# Patient Record
Sex: Female | Born: 1985 | Race: White | Hispanic: No | Marital: Single | Smoking: Never smoker
Health system: Southern US, Community
[De-identification: ages and names within clinical notes are randomized; demographics above are authoritative.]

## PROBLEM LIST (undated history)

## (undated) DIAGNOSIS — S8254XA Nondisplaced fracture of medial malleolus of right tibia, initial encounter for closed fracture: Secondary | ICD-10-CM

## (undated) DIAGNOSIS — S91001A Unspecified open wound, right ankle, initial encounter: Secondary | ICD-10-CM

## (undated) DIAGNOSIS — S82143A Displaced bicondylar fracture of unspecified tibia, initial encounter for closed fracture: Secondary | ICD-10-CM

## (undated) DIAGNOSIS — S9304XA Dislocation of right ankle joint, initial encounter: Secondary | ICD-10-CM

## (undated) DIAGNOSIS — R569 Unspecified convulsions: Secondary | ICD-10-CM

---

## 2015-08-07 ENCOUNTER — Emergency Department (HOSPITAL_COMMUNITY): Payer: No Typology Code available for payment source

## 2015-08-07 ENCOUNTER — Encounter (HOSPITAL_COMMUNITY): Payer: Self-pay | Admitting: Emergency Medicine

## 2015-08-07 ENCOUNTER — Inpatient Hospital Stay (HOSPITAL_COMMUNITY)
Admission: EM | Admit: 2015-08-07 | Discharge: 2015-08-14 | DRG: 464 | Disposition: A | Discharge status: Home Health | Payer: No Typology Code available for payment source | Attending: Orthopedic Surgery | Admitting: Orthopedic Surgery

## 2015-08-07 DIAGNOSIS — S8254XA Nondisplaced fracture of medial malleolus of right tibia, initial encounter for closed fracture: Secondary | ICD-10-CM | POA: Diagnosis present

## 2015-08-07 DIAGNOSIS — S82141A Displaced bicondylar fracture of right tibia, initial encounter for closed fracture: Secondary | ICD-10-CM

## 2015-08-07 DIAGNOSIS — Z79899 Other long term (current) drug therapy: Secondary | ICD-10-CM

## 2015-08-07 DIAGNOSIS — S20212A Contusion of left front wall of thorax, initial encounter: Secondary | ICD-10-CM

## 2015-08-07 DIAGNOSIS — S82143A Displaced bicondylar fracture of unspecified tibia, initial encounter for closed fracture: Secondary | ICD-10-CM

## 2015-08-07 DIAGNOSIS — S8253XA Displaced fracture of medial malleolus of unspecified tibia, initial encounter for closed fracture: Secondary | ICD-10-CM

## 2015-08-07 DIAGNOSIS — S9304XA Dislocation of right ankle joint, initial encounter: Secondary | ICD-10-CM

## 2015-08-07 DIAGNOSIS — R569 Unspecified convulsions: Secondary | ICD-10-CM | POA: Diagnosis present

## 2015-08-07 DIAGNOSIS — S8251XB Displaced fracture of medial malleolus of right tibia, initial encounter for open fracture type I or II: Secondary | ICD-10-CM | POA: Diagnosis present

## 2015-08-07 DIAGNOSIS — M25571 Pain in right ankle and joints of right foot: Secondary | ICD-10-CM | POA: Diagnosis present

## 2015-08-07 DIAGNOSIS — Y92410 Unspecified street and highway as the place of occurrence of the external cause: Secondary | ICD-10-CM | POA: Diagnosis not present

## 2015-08-07 DIAGNOSIS — D62 Acute posthemorrhagic anemia: Secondary | ICD-10-CM | POA: Diagnosis not present

## 2015-08-07 DIAGNOSIS — Z419 Encounter for procedure for purposes other than remedying health state, unspecified: Secondary | ICD-10-CM

## 2015-08-07 DIAGNOSIS — S82301A Unspecified fracture of lower end of right tibia, initial encounter for closed fracture: Secondary | ICD-10-CM

## 2015-08-07 DIAGNOSIS — T148XXA Other injury of unspecified body region, initial encounter: Secondary | ICD-10-CM

## 2015-08-07 DIAGNOSIS — S91001A Unspecified open wound, right ankle, initial encounter: Secondary | ICD-10-CM | POA: Diagnosis present

## 2015-08-07 HISTORY — DX: Unspecified convulsions: R56.9

## 2015-08-07 HISTORY — DX: Dislocation of right ankle joint, initial encounter: S93.04XA

## 2015-08-07 HISTORY — DX: Displaced bicondylar fracture of unspecified tibia, initial encounter for closed fracture: S82.143A

## 2015-08-07 HISTORY — DX: Nondisplaced fracture of medial malleolus of right tibia, initial encounter for closed fracture: S82.54XA

## 2015-08-07 HISTORY — DX: Unspecified open wound, right ankle, initial encounter: S91.001A

## 2015-08-07 LAB — URINALYSIS, ROUTINE W REFLEX MICROSCOPIC
BILIRUBIN URINE: NEGATIVE
Glucose, UA: NEGATIVE mg/dL
HGB URINE DIPSTICK: NEGATIVE
Ketones, ur: 40 mg/dL — AB
Nitrite: NEGATIVE
PROTEIN: NEGATIVE mg/dL
Specific Gravity, Urine: 1.02 (ref 1.005–1.030)
pH: 5 (ref 5.0–8.0)

## 2015-08-07 LAB — BASIC METABOLIC PANEL
Anion gap: 10 (ref 5–15)
BUN: 11 mg/dL (ref 6–20)
CO2: 19 mmol/L — ABNORMAL LOW (ref 22–32)
Calcium: 9.1 mg/dL (ref 8.9–10.3)
Chloride: 107 mmol/L (ref 101–111)
Creatinine, Ser: 0.87 mg/dL (ref 0.44–1.00)
GFR calc Af Amer: >60 mL/min (ref >60)
GFR calc non Af Amer: >60 mL/min (ref >60)
Glucose, Bld: 117 mg/dL — ABNORMAL HIGH (ref 65–99)
Potassium: 3.5 mmol/L (ref 3.5–5.1)
Sodium: 136 mmol/L (ref 135–145)

## 2015-08-07 LAB — CBC WITH DIFFERENTIAL/PLATELET
Basophils Absolute: 0 10*3/uL (ref 0.0–0.1)
Basophils Relative: 0 %
Eosinophils Absolute: 0 10*3/uL (ref 0.0–0.7)
Eosinophils Relative: 0 %
HCT: 39.3 % (ref 36.0–46.0)
Hemoglobin: 13.1 g/dL (ref 12.0–15.0)
Lymphocytes Relative: 10 %
Lymphs Abs: 2.1 10*3/uL (ref 0.7–4.0)
MCH: 30.7 pg (ref 26.0–34.0)
MCHC: 33.3 g/dL (ref 30.0–36.0)
MCV: 92 fL (ref 78.0–100.0)
Monocytes Absolute: 0.8 10*3/uL (ref 0.1–1.0)
Monocytes Relative: 4 %
Neutro Abs: 16.9 10*3/uL — ABNORMAL HIGH (ref 1.7–7.7)
Neutrophils Relative %: 86 %
Platelets: 361 10*3/uL (ref 150–400)
RBC: 4.27 MIL/uL (ref 3.87–5.11)
RDW: 12.8 % (ref 11.5–15.5)
WBC: 19.9 10*3/uL — ABNORMAL HIGH (ref 4.0–10.5)

## 2015-08-07 LAB — URINE MICROSCOPIC-ADD ON

## 2015-08-07 LAB — TYPE AND SCREEN
ABO/RH(D): O POS
Antibody Screen: NEGATIVE

## 2015-08-07 LAB — ABO/RH: ABO/RH(D): O POS

## 2015-08-07 LAB — I-STAT BETA HCG BLOOD, ED (MC, WL, AP ONLY): I-stat hCG, quantitative: <5 m[IU]/mL (ref <5)

## 2015-08-07 MED ORDER — ONDANSETRON HCL 4 MG/2ML IJ SOLN: 4.0000 mg | Freq: Three times a day (TID) | INTRAMUSCULAR | Status: AC | PRN

## 2015-08-07 MED ORDER — TETANUS-DIPHTH-ACELL PERTUSSIS 5-2.5-18.5 LF-MCG/0.5 IM SUSP
0.5000 mL | Freq: Once | INTRAMUSCULAR | Status: AC
  Administered 2015-08-07: 0.5 mL via INTRAMUSCULAR
  Filled 2015-08-07: qty 0.5

## 2015-08-07 MED ORDER — NORETHIN ACE-ETH ESTRAD-FE 1-20 MG-MCG PO TABS
1.0000 | ORAL_TABLET | Freq: Every day | ORAL | Status: DC
  Administered 2015-08-08 – 2015-08-14 (×4): 1 via ORAL

## 2015-08-07 MED ORDER — OXYCODONE HCL 5 MG PO TABS
5.0000 mg | ORAL_TABLET | ORAL | Status: DC | PRN
  Administered 2015-08-07 (×2): 5 mg via ORAL
  Filled 2015-08-07 (×2): qty 1

## 2015-08-07 MED ORDER — CEFAZOLIN SODIUM-DEXTROSE 2-4 GM/100ML-% IV SOLN
2.0000 g | INTRAVENOUS | Status: DC
  Filled 2015-08-07: qty 100

## 2015-08-07 MED ORDER — ASPIRIN EC 325 MG PO TBEC
325.0000 mg | DELAYED_RELEASE_TABLET | Freq: Every day | ORAL | Status: DC
  Administered 2015-08-07: 325 mg via ORAL
  Filled 2015-08-07: qty 1

## 2015-08-07 MED ORDER — ACETAMINOPHEN 650 MG RE SUPP: 650.0000 mg | Freq: Four times a day (QID) | RECTAL | Status: DC | PRN

## 2015-08-07 MED ORDER — IOPAMIDOL (ISOVUE-300) INJECTION 61%
INTRAVENOUS | Status: AC
  Administered 2015-08-07: 100 mL
  Filled 2015-08-07: qty 100

## 2015-08-07 MED ORDER — HYDROMORPHONE HCL 1 MG/ML IJ SOLN
1.0000 mg | INTRAMUSCULAR | Status: DC | PRN
  Administered 2015-08-07 – 2015-08-11 (×20): 1 mg via INTRAVENOUS
  Filled 2015-08-07 (×20): qty 1

## 2015-08-07 MED ORDER — MORPHINE SULFATE (PF) 4 MG/ML IV SOLN
4.0000 mg | Freq: Once | INTRAVENOUS | Status: AC
  Administered 2015-08-07: 4 mg via INTRAVENOUS
  Filled 2015-08-07: qty 1

## 2015-08-07 MED ORDER — CEFAZOLIN SODIUM-DEXTROSE 2-4 GM/100ML-% IV SOLN
2.0000 g | INTRAVENOUS | Status: AC
  Filled 2015-08-07: qty 100

## 2015-08-07 MED ORDER — CEFAZOLIN SODIUM 1-5 GM-% IV SOLN
1.0000 g | Freq: Once | INTRAVENOUS | Status: AC
  Administered 2015-08-07: 1 g via INTRAVENOUS
  Filled 2015-08-07: qty 50

## 2015-08-07 MED ORDER — ACETAMINOPHEN 325 MG PO TABS: 650.0000 mg | ORAL_TABLET | Freq: Four times a day (QID) | ORAL | Status: DC | PRN

## 2015-08-07 MED ORDER — SODIUM CHLORIDE 0.45 % IV SOLN
INTRAVENOUS | Status: DC
  Administered 2015-08-08: 07:00:00 via INTRAVENOUS

## 2015-08-07 MED ORDER — HYDROMORPHONE HCL 1 MG/ML IJ SOLN: 1.0000 mg | Freq: Once | INTRAMUSCULAR | Status: DC

## 2015-08-07 MED ORDER — LEVETIRACETAM 500 MG PO TABS: 500.0000 mg | ORAL_TABLET | Freq: Two times a day (BID) | ORAL | Status: DC
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
## 2015-08-07 NOTE — ED Notes (Signed)
Chris L.PA at bedside.

## 2015-08-07 NOTE — Progress Notes (Signed)
Orthopedic Tech Progress Note Patient Details:  Cassandra Price 07/25/1985 [id number] Applied fiberglass short leg splint to RLE and applied Velcro knee immobilizer to RLE.  Pulses, sensation, motion intact before and after splinting.  Capillary refill less than 2 seconds before and after splinting. Ortho Devices Type of Ortho Device: Post (short leg) splint, Knee Immobilizer Ortho Device/Splint Location: RLE Ortho Device/Splint Interventions: Application   Lesle ChrisGilliland, Soren Pigman L 08/07/2015, 3:43 PM

## 2015-08-08 ENCOUNTER — Inpatient Hospital Stay (HOSPITAL_COMMUNITY): Payer: No Typology Code available for payment source

## 2015-08-08 DIAGNOSIS — S82143A Displaced bicondylar fracture of unspecified tibia, initial encounter for closed fracture: Secondary | ICD-10-CM | POA: Diagnosis present

## 2015-08-08 LAB — IRON AND TIBC
Iron: 42 ug/dL (ref 28–170)
Saturation Ratios: 14 % (ref 10.4–31.8)
TIBC: 290 ug/dL (ref 250–450)
UIBC: 248 ug/dL

## 2015-08-08 LAB — FOLATE: FOLATE: 21.8 ng/mL (ref >5.9)

## 2015-08-08 LAB — RETICULOCYTES
RBC.: 3.57 MIL/uL — AB (ref 3.87–5.11)
RETIC COUNT ABSOLUTE: 60.7 10*3/uL (ref 19.0–186.0)
Retic Ct Pct: 1.7 % (ref 0.4–3.1)

## 2015-08-08 LAB — TROPONIN I: TROPONIN I: 0.03 ng/mL (ref <0.031)

## 2015-08-08 LAB — VITAMIN B12: VITAMIN B 12: 575 pg/mL (ref 180–914)

## 2015-08-08 LAB — FERRITIN: Ferritin: 112 ng/mL (ref 11–307)

## 2015-08-08 LAB — MAGNESIUM: MAGNESIUM: 2.2 mg/dL (ref 1.7–2.4)

## 2015-08-08 LAB — GLUCOSE, CAPILLARY
GLUCOSE-CAPILLARY: 121 mg/dL — AB (ref 65–99)
Glucose-Capillary: 89 mg/dL (ref 65–99)

## 2015-08-08 LAB — PHOSPHORUS: PHOSPHORUS: 2.4 mg/dL — AB (ref 2.5–4.6)

## 2015-08-08 LAB — MRSA PCR SCREENING: MRSA by PCR: NEGATIVE

## 2015-08-08 MED ORDER — METHOCARBAMOL 1000 MG/10ML IJ SOLN
500.0000 mg | Freq: Four times a day (QID) | INTRAVENOUS | Status: DC | PRN
  Filled 2015-08-08: qty 5

## 2015-08-08 MED ORDER — METHOCARBAMOL 500 MG PO TABS
500.0000 mg | ORAL_TABLET | Freq: Four times a day (QID) | ORAL | Status: DC | PRN
  Administered 2015-08-08: 500 mg via ORAL
  Administered 2015-08-09: 1000 mg via ORAL
  Administered 2015-08-09 – 2015-08-10 (×2): 500 mg via ORAL
  Administered 2015-08-10 (×2): 1000 mg via ORAL
  Filled 2015-08-08: qty 1
  Filled 2015-08-08: qty 2
  Filled 2015-08-08: qty 1
  Filled 2015-08-08: qty 2
  Filled 2015-08-08: qty 1
  Filled 2015-08-08: qty 2

## 2015-08-08 MED ORDER — ONDANSETRON HCL 4 MG/2ML IJ SOLN: 4.0000 mg | Freq: Four times a day (QID) | INTRAMUSCULAR | Status: DC | PRN

## 2015-08-08 MED ORDER — OXYCODONE HCL 5 MG PO TABS
5.0000 mg | ORAL_TABLET | ORAL | Status: DC | PRN
  Administered 2015-08-08 – 2015-08-10 (×4): 10 mg via ORAL
  Filled 2015-08-08 (×4): qty 2

## 2015-08-08 MED ORDER — OXYCODONE-ACETAMINOPHEN 5-325 MG PO TABS
1.0000 | ORAL_TABLET | Freq: Four times a day (QID) | ORAL | Status: DC | PRN
  Administered 2015-08-09 – 2015-08-10 (×2): 2 via ORAL
  Filled 2015-08-08 (×2): qty 2

## 2015-08-08 MED ORDER — ACETAMINOPHEN 325 MG PO TABS: 650.0000 mg | ORAL_TABLET | Freq: Four times a day (QID) | ORAL | Status: DC | PRN

## 2015-08-08 MED ORDER — LEVETIRACETAM 500 MG PO TABS
500.0000 mg | ORAL_TABLET | Freq: Two times a day (BID) | ORAL | Status: DC
  Administered 2015-08-08 – 2015-08-14 (×13): 500 mg via ORAL
  Filled 2015-08-08 (×11): qty 1

## 2015-08-08 MED ORDER — ENOXAPARIN SODIUM 40 MG/0.4ML ~~LOC~~ SOLN
40.0000 mg | SUBCUTANEOUS | Status: DC
  Administered 2015-08-08 – 2015-08-14 (×7): 40 mg via SUBCUTANEOUS
  Filled 2015-08-08 (×7): qty 0.4

## 2015-08-08 MED ORDER — POTASSIUM CHLORIDE IN NACL 20-0.9 MEQ/L-% IV SOLN
INTRAVENOUS | Status: DC
  Administered 2015-08-08 – 2015-08-13 (×6): via INTRAVENOUS
  Filled 2015-08-08 (×7): qty 1000

## 2015-08-08 MED ORDER — POLYETHYLENE GLYCOL 3350 17 G PO PACK
17.0000 g | PACK | Freq: Every day | ORAL | Status: DC
  Administered 2015-08-08 – 2015-08-10 (×3): 17 g via ORAL
  Filled 2015-08-08 (×3): qty 1

## 2015-08-08 MED ORDER — ACETAMINOPHEN 650 MG RE SUPP: 650.0000 mg | Freq: Four times a day (QID) | RECTAL | Status: DC | PRN

## 2015-08-08 MED ORDER — DOCUSATE SODIUM 100 MG PO CAPS
100.0000 mg | ORAL_CAPSULE | Freq: Two times a day (BID) | ORAL | Status: DC
  Administered 2015-08-08 – 2015-08-14 (×12): 100 mg via ORAL
  Filled 2015-08-08 (×13): qty 1

## 2015-08-08 MED ORDER — ONDANSETRON HCL 4 MG PO TABS: 4.0000 mg | ORAL_TABLET | Freq: Four times a day (QID) | ORAL | Status: DC | PRN
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
## 2015-08-09 LAB — COMPREHENSIVE METABOLIC PANEL
ALBUMIN: 3.2 g/dL — AB (ref 3.5–5.0)
ALT: 53 U/L (ref 14–54)
AST: 25 U/L (ref 15–41)
Alkaline Phosphatase: 42 U/L (ref 38–126)
Anion gap: 6 (ref 5–15)
BILIRUBIN TOTAL: 0.9 mg/dL (ref 0.3–1.2)
CHLORIDE: 106 mmol/L (ref 101–111)
CO2: 24 mmol/L (ref 22–32)
Calcium: 8.3 mg/dL — ABNORMAL LOW (ref 8.9–10.3)
Creatinine, Ser: 0.67 mg/dL (ref 0.44–1.00)
GFR calc Af Amer: >60 mL/min (ref >60)
GFR calc non Af Amer: >60 mL/min (ref >60)
GLUCOSE: 101 mg/dL — AB (ref 65–99)
POTASSIUM: 3.9 mmol/L (ref 3.5–5.1)
Sodium: 136 mmol/L (ref 135–145)
Total Protein: 5.8 g/dL — ABNORMAL LOW (ref 6.5–8.1)

## 2015-08-09 LAB — CBC
HEMATOCRIT: 31.4 % — AB (ref 36.0–46.0)
Hemoglobin: 10.3 g/dL — ABNORMAL LOW (ref 12.0–15.0)
MCH: 31.1 pg (ref 26.0–34.0)
MCHC: 32.8 g/dL (ref 30.0–36.0)
MCV: 94.9 fL (ref 78.0–100.0)
PLATELETS: 218 10*3/uL (ref 150–400)
RBC: 3.31 MIL/uL — ABNORMAL LOW (ref 3.87–5.11)
RDW: 12.9 % (ref 11.5–15.5)
WBC: 10.2 10*3/uL (ref 4.0–10.5)

## 2015-08-09 LAB — TROPONIN I

## 2015-08-09 LAB — PROTIME-INR
INR: 1.12 (ref 0.00–1.49)
Prothrombin Time: 14.6 seconds (ref 11.6–15.2)

## 2015-08-09 NOTE — Progress Notes (Signed)
PT Cancellation Note  Patient Details Name: Randall HissMagdalene Koegel MRN: [id number] DOB: 09/16/1985   Cancelled Treatment:    Reason Eval/Treat Not Completed: Patient not medically ready (pt on bedrest per Dr Carola FrostHandy and for surgery today or tomorrow; )   Drucilla ChaletWILLIAMS,Lilia Letterman 08/09/2015, 8:32 AM
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
## 2015-08-09 NOTE — Progress Notes (Signed)
OT Cancellation Note  Patient Details Name: Cassandra Price MRN: [id number] DOB: 04/03/1985   Cancelled Treatment:    Reason Eval/Treat Not Completed: Patient not medically ready(pt on bedrest per Dr Carola FrostHandy and for surgery today or tomorrow)  [name], [name] 08/09/2015, 9:30 AM

## 2015-08-10 MED ORDER — CEFAZOLIN SODIUM-DEXTROSE 2-4 GM/100ML-% IV SOLN
2.0000 g | Freq: Once | INTRAVENOUS | Status: AC
  Administered 2015-08-11 (×2): 2 g via INTRAVENOUS
  Filled 2015-08-10: qty 100

## 2015-08-10 NOTE — Progress Notes (Signed)
PT Cancellation Note  Patient Details Name: Cassandra Price MRN: [id number] DOB: 07/26/1985   Cancelled Treatment:    Reason Eval/Treat Not Completed: Patient not medically ready (pt on bedrest; plan for surgery Thursday 08/10/15)  Clarita CraneStephanie F Ladana Chavero, PT, DPT [phone]

## 2015-08-10 NOTE — Progress Notes (Signed)
OT Cancellation Note  Patient Details Name: Cassandra Price MRN: [id number] DOB: 02/11/1986   Cancelled Treatment:    Reason Eval/Treat Not Completed:  (Pt on bed rest. Please update activity orders when appropriate for OT evaluation.)  Earlie RavelingStraub, Marylon Verno L OTR/L [phone] 08/10/2015, 8:01 AM
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
## 2015-08-11 ENCOUNTER — Encounter (HOSPITAL_COMMUNITY): Payer: Self-pay | Admitting: Anesthesiology

## 2015-08-11 ENCOUNTER — Inpatient Hospital Stay (HOSPITAL_COMMUNITY): Payer: No Typology Code available for payment source

## 2015-08-11 ENCOUNTER — Encounter (HOSPITAL_COMMUNITY)
Admission: EM | Disposition: A | Discharge status: Home Health | Payer: Self-pay | Source: Home / Self Care | Attending: Orthopedic Surgery

## 2015-08-11 ENCOUNTER — Inpatient Hospital Stay (HOSPITAL_COMMUNITY): Payer: No Typology Code available for payment source | Admitting: Anesthesiology

## 2015-08-11 HISTORY — PX: ORIF TIBIA PLATEAU: SHX2132

## 2015-08-11 HISTORY — PX: INCISION AND DRAINAGE: SHX5863

## 2015-08-11 LAB — CBC WITH DIFFERENTIAL/PLATELET
BASOS PCT: 0 %
Basophils Absolute: 0 10*3/uL (ref 0.0–0.1)
EOS ABS: 0.1 10*3/uL (ref 0.0–0.7)
Eosinophils Relative: 1 %
HCT: 29.9 % — ABNORMAL LOW (ref 36.0–46.0)
Hemoglobin: 9.6 g/dL — ABNORMAL LOW (ref 12.0–15.0)
Lymphocytes Relative: 25 %
Lymphs Abs: 1.8 10*3/uL (ref 0.7–4.0)
MCH: 30.6 pg (ref 26.0–34.0)
MCHC: 32.1 g/dL (ref 30.0–36.0)
MCV: 95.2 fL (ref 78.0–100.0)
MONO ABS: 0.6 10*3/uL (ref 0.1–1.0)
MONOS PCT: 8 %
NEUTROS PCT: 66 %
Neutro Abs: 4.6 10*3/uL (ref 1.7–7.7)
PLATELETS: 244 10*3/uL (ref 150–400)
RBC: 3.14 MIL/uL — ABNORMAL LOW (ref 3.87–5.11)
RDW: 13.1 % (ref 11.5–15.5)
WBC: 7 10*3/uL (ref 4.0–10.5)

## 2015-08-11 LAB — COMPREHENSIVE METABOLIC PANEL
ALBUMIN: 3.1 g/dL — AB (ref 3.5–5.0)
ALT: 45 U/L (ref 14–54)
ANION GAP: 5 (ref 5–15)
AST: 29 U/L (ref 15–41)
Alkaline Phosphatase: 40 U/L (ref 38–126)
BILIRUBIN TOTAL: 0.7 mg/dL (ref 0.3–1.2)
BUN: <5 mg/dL — ABNORMAL LOW (ref 6–20)
CALCIUM: 8.6 mg/dL — AB (ref 8.9–10.3)
CO2: 25 mmol/L (ref 22–32)
CREATININE: 0.64 mg/dL (ref 0.44–1.00)
Chloride: 105 mmol/L (ref 101–111)
GFR calc non Af Amer: >60 mL/min (ref >60)
Glucose, Bld: 96 mg/dL (ref 65–99)
Potassium: 3.9 mmol/L (ref 3.5–5.1)
SODIUM: 135 mmol/L (ref 135–145)
TOTAL PROTEIN: 6.3 g/dL — AB (ref 6.5–8.1)

## 2015-08-11 LAB — APTT: aPTT: 34 seconds (ref 24–37)

## 2015-08-11 LAB — TYPE AND SCREEN
ABO/RH(D): O POS
ANTIBODY SCREEN: NEGATIVE

## 2015-08-11 LAB — PROTIME-INR
INR: 1.07 (ref 0.00–1.49)
PROTHROMBIN TIME: 14.1 s (ref 11.6–15.2)

## 2015-08-11 SURGERY — OPEN REDUCTION INTERNAL FIXATION (ORIF) TIBIAL PLATEAU
Anesthesia: General | Site: Leg Lower | Laterality: Right

## 2015-08-11 MED ORDER — METHOCARBAMOL 1000 MG/10ML IJ SOLN
500.0000 mg | Freq: Four times a day (QID) | INTRAVENOUS | Status: DC
  Filled 2015-08-11 (×15): qty 5

## 2015-08-11 MED ORDER — DIPHENHYDRAMINE HCL 50 MG/ML IJ SOLN: 12.5000 mg | Freq: Four times a day (QID) | INTRAMUSCULAR | Status: DC | PRN

## 2015-08-11 MED ORDER — SODIUM CHLORIDE 0.9% FLUSH: 9.0000 mL | INTRAVENOUS | Status: DC | PRN

## 2015-08-11 MED ORDER — HYDROMORPHONE HCL 1 MG/ML IJ SOLN
INTRAMUSCULAR | Status: DC | PRN
  Administered 2015-08-11: 1 mg via INTRAVENOUS

## 2015-08-11 MED ORDER — HYDROMORPHONE HCL 1 MG/ML IJ SOLN
INTRAMUSCULAR | Status: AC
  Filled 2015-08-11: qty 1

## 2015-08-11 MED ORDER — LACTATED RINGERS IV SOLN
INTRAVENOUS | Status: DC | PRN
  Administered 2015-08-11 (×2): via INTRAVENOUS

## 2015-08-11 MED ORDER — ONDANSETRON HCL 4 MG/2ML IJ SOLN: 4.0000 mg | Freq: Four times a day (QID) | INTRAMUSCULAR | Status: DC | PRN

## 2015-08-11 MED ORDER — NEOSTIGMINE METHYLSULFATE 10 MG/10ML IV SOLN
INTRAVENOUS | Status: DC | PRN
  Administered 2015-08-11: 3 mg via INTRAVENOUS

## 2015-08-11 MED ORDER — FENTANYL CITRATE (PF) 250 MCG/5ML IJ SOLN
INTRAMUSCULAR | Status: AC
  Filled 2015-08-11: qty 5

## 2015-08-11 MED ORDER — OXYCODONE-ACETAMINOPHEN 5-325 MG PO TABS
1.0000 | ORAL_TABLET | Freq: Four times a day (QID) | ORAL | Status: DC | PRN
  Administered 2015-08-12 – 2015-08-13 (×3): 2 via ORAL
  Filled 2015-08-11 (×3): qty 2

## 2015-08-11 MED ORDER — ONDANSETRON HCL 4 MG/2ML IJ SOLN
INTRAMUSCULAR | Status: DC | PRN
  Administered 2015-08-11: 4 mg via INTRAVENOUS

## 2015-08-11 MED ORDER — METOCLOPRAMIDE HCL 5 MG/ML IJ SOLN: 5.0000 mg | Freq: Three times a day (TID) | INTRAMUSCULAR | Status: DC | PRN

## 2015-08-11 MED ORDER — METOCLOPRAMIDE HCL 5 MG PO TABS: 5.0000 mg | ORAL_TABLET | Freq: Three times a day (TID) | ORAL | Status: DC | PRN

## 2015-08-11 MED ORDER — OXYCODONE HCL 5 MG/5ML PO SOLN: 15.0000 mg | Freq: Once | ORAL | Status: DC | PRN

## 2015-08-11 MED ORDER — DEXAMETHASONE SODIUM PHOSPHATE 10 MG/ML IJ SOLN
INTRAMUSCULAR | Status: DC | PRN
  Administered 2015-08-11: 10 mg via INTRAVENOUS

## 2015-08-11 MED ORDER — ACETAMINOPHEN 650 MG RE SUPP: 650.0000 mg | Freq: Four times a day (QID) | RECTAL | Status: DC | PRN

## 2015-08-11 MED ORDER — PROPOFOL 10 MG/ML IV BOLUS
INTRAVENOUS | Status: DC | PRN
  Administered 2015-08-11: 200 mg via INTRAVENOUS

## 2015-08-11 MED ORDER — GENTAMICIN IN SALINE 1-0.9 MG/ML-% IV SOLN
100.0000 mg | Freq: Three times a day (TID) | INTRAVENOUS | Status: AC
  Administered 2015-08-11 – 2015-08-13 (×5): 100 mg via INTRAVENOUS
  Filled 2015-08-11 (×5): qty 100

## 2015-08-11 MED ORDER — METHOCARBAMOL 500 MG PO TABS
1000.0000 mg | ORAL_TABLET | Freq: Four times a day (QID) | ORAL | Status: DC
  Administered 2015-08-11 – 2015-08-14 (×12): 1000 mg via ORAL
  Filled 2015-08-11 (×13): qty 2

## 2015-08-11 MED ORDER — ACETAMINOPHEN 325 MG PO TABS: 650.0000 mg | ORAL_TABLET | Freq: Four times a day (QID) | ORAL | Status: DC | PRN

## 2015-08-11 MED ORDER — ONDANSETRON HCL 4 MG PO TABS: 4.0000 mg | ORAL_TABLET | Freq: Four times a day (QID) | ORAL | Status: DC | PRN

## 2015-08-11 MED ORDER — HYDROMORPHONE HCL 1 MG/ML IJ SOLN: 0.5000 mg | INTRAMUSCULAR | Status: DC | PRN

## 2015-08-11 MED ORDER — MAGNESIUM CITRATE PO SOLN: 1.0000 | Freq: Once | ORAL | Status: DC | PRN

## 2015-08-11 MED ORDER — METOCLOPRAMIDE HCL 5 MG/ML IJ SOLN: 10.0000 mg | Freq: Once | INTRAMUSCULAR | Status: DC | PRN

## 2015-08-11 MED ORDER — MIDAZOLAM HCL 2 MG/2ML IJ SOLN
INTRAMUSCULAR | Status: AC
  Filled 2015-08-11: qty 2

## 2015-08-11 MED ORDER — ROCURONIUM BROMIDE 100 MG/10ML IV SOLN
INTRAVENOUS | Status: DC | PRN
  Administered 2015-08-11: 50 mg via INTRAVENOUS
  Administered 2015-08-11 (×2): 10 mg via INTRAVENOUS
  Administered 2015-08-11: 20 mg via INTRAVENOUS
  Administered 2015-08-11: 10 mg via INTRAVENOUS
  Administered 2015-08-11: 20 mg via INTRAVENOUS

## 2015-08-11 MED ORDER — MIDAZOLAM HCL 5 MG/5ML IJ SOLN
INTRAMUSCULAR | Status: DC | PRN
  Administered 2015-08-11: 2 mg via INTRAVENOUS

## 2015-08-11 MED ORDER — DOCUSATE SODIUM 100 MG PO CAPS: 100.0000 mg | ORAL_CAPSULE | Freq: Two times a day (BID) | ORAL | Status: DC

## 2015-08-11 MED ORDER — GLYCOPYRROLATE 0.2 MG/ML IJ SOLN
INTRAMUSCULAR | Status: DC | PRN
  Administered 2015-08-11: 0.4 mg via INTRAVENOUS

## 2015-08-11 MED ORDER — DEXTROSE 5 % IV SOLN
1000.0000 mg | Freq: Once | INTRAVENOUS | Status: AC
  Administered 2015-08-11: 1000 mg via INTRAVENOUS
  Filled 2015-08-11: qty 10

## 2015-08-11 MED ORDER — HYDROMORPHONE HCL 1 MG/ML IJ SOLN
1.0000 mg | Freq: Once | INTRAMUSCULAR | Status: AC
  Administered 2015-08-11: 1 mg via INTRAVENOUS
  Filled 2015-08-11: qty 1

## 2015-08-11 MED ORDER — PROPOFOL 10 MG/ML IV BOLUS
INTRAVENOUS | Status: AC
  Filled 2015-08-11: qty 20

## 2015-08-11 MED ORDER — DIPHENHYDRAMINE HCL 12.5 MG/5ML PO ELIX: 12.5000 mg | ORAL_SOLUTION | ORAL | Status: DC | PRN

## 2015-08-11 MED ORDER — NEOSTIGMINE METHYLSULFATE 5 MG/5ML IV SOSY
PREFILLED_SYRINGE | INTRAVENOUS | Status: AC
  Filled 2015-08-11: qty 5

## 2015-08-11 MED ORDER — 0.9 % SODIUM CHLORIDE (POUR BTL) OPTIME
TOPICAL | Status: DC | PRN
  Administered 2015-08-11: 1000 mL

## 2015-08-11 MED ORDER — GLYCOPYRROLATE 0.2 MG/ML IV SOSY
PREFILLED_SYRINGE | INTRAVENOUS | Status: AC
  Filled 2015-08-11: qty 3

## 2015-08-11 MED ORDER — CEFAZOLIN SODIUM 1-5 GM-% IV SOLN
1.0000 g | Freq: Three times a day (TID) | INTRAVENOUS | Status: AC
  Administered 2015-08-11 – 2015-08-13 (×6): 1 g via INTRAVENOUS
  Filled 2015-08-11 (×6): qty 50

## 2015-08-11 MED ORDER — HYDROMORPHONE 1 MG/ML IV SOLN
INTRAVENOUS | Status: DC
  Administered 2015-08-11: 3 mg via INTRAVENOUS
  Administered 2015-08-11: 15:00:00 via INTRAVENOUS
  Administered 2015-08-12: 1.8 mg via INTRAVENOUS
  Administered 2015-08-12: 1.5 mg via INTRAVENOUS
  Administered 2015-08-12: 2.1 mg via INTRAVENOUS
  Administered 2015-08-12: 1.6 mg via INTRAVENOUS
  Administered 2015-08-12: 3 mg via INTRAVENOUS
  Administered 2015-08-12: 2.1 mg via INTRAVENOUS
  Administered 2015-08-13: 0.6 mg via INTRAVENOUS
  Administered 2015-08-13: 0.9 mg via INTRAVENOUS
  Administered 2015-08-13: 09:00:00 via INTRAVENOUS
  Administered 2015-08-13: 1.5 mg via INTRAVENOUS
  Administered 2015-08-13: 2.1 mg via INTRAVENOUS
  Administered 2015-08-13: 1.8 mg via INTRAVENOUS
  Administered 2015-08-14: 2.1 mg via INTRAVENOUS
  Filled 2015-08-11 (×2): qty 25

## 2015-08-11 MED ORDER — POLYETHYLENE GLYCOL 3350 17 G PO PACK
17.0000 g | PACK | Freq: Every day | ORAL | Status: DC
  Administered 2015-08-11 – 2015-08-14 (×4): 17 g via ORAL
  Filled 2015-08-11 (×4): qty 1

## 2015-08-11 MED ORDER — BISACODYL 5 MG PO TBEC: 5.0000 mg | DELAYED_RELEASE_TABLET | Freq: Every day | ORAL | Status: DC | PRN

## 2015-08-11 MED ORDER — GENTAMICIN SULFATE 40 MG/ML IJ SOLN
1.5000 mg/kg | Freq: Once | INTRAVENOUS | Status: AC
  Administered 2015-08-11: 120 mg via INTRAVENOUS
  Filled 2015-08-11: qty 3

## 2015-08-11 MED ORDER — OXYCODONE HCL 5 MG PO TABS: 5.0000 mg | ORAL_TABLET | ORAL | Status: DC | PRN

## 2015-08-11 MED ORDER — OXYCODONE HCL 5 MG PO TABS: 15.0000 mg | ORAL_TABLET | Freq: Once | ORAL | Status: DC | PRN

## 2015-08-11 MED ORDER — NALOXONE HCL 0.4 MG/ML IJ SOLN: 0.4000 mg | INTRAMUSCULAR | Status: DC | PRN

## 2015-08-11 MED ORDER — DIPHENHYDRAMINE HCL 12.5 MG/5ML PO ELIX: 12.5000 mg | ORAL_SOLUTION | Freq: Four times a day (QID) | ORAL | Status: DC | PRN

## 2015-08-11 MED ORDER — LIDOCAINE HCL (CARDIAC) 20 MG/ML IV SOLN
INTRAVENOUS | Status: DC | PRN
  Administered 2015-08-11: 100 mg via INTRAVENOUS

## 2015-08-11 MED ORDER — ENOXAPARIN SODIUM 40 MG/0.4ML ~~LOC~~ SOLN: 40.0000 mg | SUBCUTANEOUS | Status: DC

## 2015-08-11 MED ORDER — FENTANYL CITRATE (PF) 100 MCG/2ML IJ SOLN
INTRAMUSCULAR | Status: DC | PRN
  Administered 2015-08-11: 50 ug via INTRAVENOUS
  Administered 2015-08-11: 100 ug via INTRAVENOUS
  Administered 2015-08-11: 50 ug via INTRAVENOUS
  Administered 2015-08-11: 100 ug via INTRAVENOUS
  Administered 2015-08-11: 50 ug via INTRAVENOUS
  Administered 2015-08-11: 25 ug via INTRAVENOUS
  Administered 2015-08-11: 50 ug via INTRAVENOUS
  Administered 2015-08-11 (×2): 100 ug via INTRAVENOUS
  Administered 2015-08-11: 25 ug via INTRAVENOUS
  Administered 2015-08-11 (×3): 100 ug via INTRAVENOUS
  Administered 2015-08-11: 50 ug via INTRAVENOUS

## 2015-08-11 SURGICAL SUPPLY — 102 items
BANDAGE ACE 4X5 VEL STRL LF (GAUZE/BANDAGES/DRESSINGS) ×4 IMPLANT
BANDAGE ACE 6X5 VEL STRL LF (GAUZE/BANDAGES/DRESSINGS) ×4 IMPLANT
BANDAGE ELASTIC 4 VELCRO ST LF (GAUZE/BANDAGES/DRESSINGS) ×4 IMPLANT
BANDAGE ELASTIC 6 VELCRO ST LF (GAUZE/BANDAGES/DRESSINGS) ×4 IMPLANT
BANDAGE ESMARK 6X9 LF (GAUZE/BANDAGES/DRESSINGS) ×2 IMPLANT
BLADE SURG 10 STRL SS (BLADE) ×4 IMPLANT
BLADE SURG 15 STRL LF DISP TIS (BLADE) ×2 IMPLANT
BLADE SURG 15 STRL SS (BLADE) ×2
BLADE SURG ROTATE 9660 (MISCELLANEOUS) IMPLANT
BNDG COHESIVE 4X5 TAN STRL (GAUZE/BANDAGES/DRESSINGS) ×4 IMPLANT
BNDG ESMARK 6X9 LF (GAUZE/BANDAGES/DRESSINGS) ×4
BNDG GAUZE ELAST 4 BULKY (GAUZE/BANDAGES/DRESSINGS) ×4 IMPLANT
BRUSH SCRUB DISP (MISCELLANEOUS) ×8 IMPLANT
CANISTER PREVENA PLUS 150 (CANNISTER) ×4 IMPLANT
CANISTER SUCT 3000ML PPV (MISCELLANEOUS) ×4 IMPLANT
COVER MAYO STAND STRL (DRAPES) ×4 IMPLANT
COVER SURGICAL LIGHT HANDLE (MISCELLANEOUS) ×4 IMPLANT
CUFF TOURNIQUET SINGLE 34IN LL (TOURNIQUET CUFF) IMPLANT
DRAPE C-ARM 42X72 X-RAY (DRAPES) ×4 IMPLANT
DRAPE C-ARMOR (DRAPES) ×4 IMPLANT
DRAPE EXTREMITY T 121X128X90 (DRAPE) IMPLANT
DRAPE INCISE IOBAN 66X45 STRL (DRAPES) ×4 IMPLANT
DRAPE ORTHO SPLIT 77X108 STRL (DRAPES)
DRAPE SURG ORHT 6 SPLT 77X108 (DRAPES) IMPLANT
DRAPE U-SHAPE 47X51 STRL (DRAPES) ×4 IMPLANT
DRILL BIT 2.5X100 214235007 (MISCELLANEOUS) ×4 IMPLANT
DRILL BIT 2.7X100 214235006 DU (MISCELLANEOUS) ×4 IMPLANT
DRSG ADAPTIC 3X8 NADH LF (GAUZE/BANDAGES/DRESSINGS) ×4 IMPLANT
DRSG PAD ABDOMINAL 8X10 ST (GAUZE/BANDAGES/DRESSINGS) ×8 IMPLANT
DRSG VAC ATS SM SENSATRAC (GAUZE/BANDAGES/DRESSINGS) ×4 IMPLANT
ELECT REM PT RETURN 9FT ADLT (ELECTROSURGICAL) ×4
ELECTRODE REM PT RTRN 9FT ADLT (ELECTROSURGICAL) ×2 IMPLANT
EVACUATOR 1/8 PVC DRAIN (DRAIN) IMPLANT
EVACUATOR 3/16  PVC DRAIN (DRAIN)
EVACUATOR 3/16 PVC DRAIN (DRAIN) IMPLANT
GAUZE SPONGE 4X4 12PLY STRL (GAUZE/BANDAGES/DRESSINGS) ×4 IMPLANT
GLOVE BIO SURGEON STRL SZ7.5 (GLOVE) ×4 IMPLANT
GLOVE BIO SURGEON STRL SZ8 (GLOVE) ×4 IMPLANT
GLOVE BIOGEL PI IND STRL 7.5 (GLOVE) ×2 IMPLANT
GLOVE BIOGEL PI IND STRL 8 (GLOVE) ×2 IMPLANT
GLOVE BIOGEL PI INDICATOR 7.5 (GLOVE) ×2
GLOVE BIOGEL PI INDICATOR 8 (GLOVE) ×2
GOWN STRL REUS W/ TWL LRG LVL3 (GOWN DISPOSABLE) ×4 IMPLANT
GOWN STRL REUS W/ TWL XL LVL3 (GOWN DISPOSABLE) ×2 IMPLANT
GOWN STRL REUS W/TWL LRG LVL3 (GOWN DISPOSABLE) ×4
GOWN STRL REUS W/TWL XL LVL3 (GOWN DISPOSABLE) ×2
IMMOBILIZER KNEE 22 UNIV (SOFTGOODS) ×4 IMPLANT
K-WIRE ACE 1.6X6 (WIRE) ×8
K-WIRE SMTH SNGL TROCAR .045X9 (WIRE) ×4
KIT BASIN OR (CUSTOM PROCEDURE TRAY) ×4 IMPLANT
KIT ROOM TURNOVER OR (KITS) ×4 IMPLANT
KWIRE ACE 1.6X6 (WIRE) ×4 IMPLANT
KWIRE SMTH SNGL TROCAR .045X9 (WIRE) ×2 IMPLANT
NDL SUT 6 .5 CRC .975X.05 MAYO (NEEDLE) ×2 IMPLANT
NEEDLE 22X1 1/2 (OR ONLY) (NEEDLE) IMPLANT
NEEDLE MAYO TAPER (NEEDLE) ×2
NS IRRIG 1000ML POUR BTL (IV SOLUTION) ×4 IMPLANT
PACK ORTHO EXTREMITY (CUSTOM PROCEDURE TRAY) ×4 IMPLANT
PAD ARMBOARD 7.5X6 YLW CONV (MISCELLANEOUS) ×8 IMPLANT
PAD CAST 4YDX4 CTTN HI CHSV (CAST SUPPLIES) ×2 IMPLANT
PADDING CAST COTTON 4X4 STRL (CAST SUPPLIES) ×2
PADDING CAST COTTON 6X4 STRL (CAST SUPPLIES) ×4 IMPLANT
PLATE LOCK 7H STD RT PROX TIB (Plate) ×4 IMPLANT
PREVENA INCISION MGT 90 150 (MISCELLANEOUS) ×4 IMPLANT
SCREW CORTICAL 3.5MM  28MM (Screw) ×2 IMPLANT
SCREW CORTICAL 3.5MM  30MM (Screw) ×2 IMPLANT
SCREW CORTICAL 3.5MM 28MM (Screw) ×2 IMPLANT
SCREW CORTICAL 3.5MM 30MM (Screw) ×2 IMPLANT
SCREW LOCK CORT STAR 3.5X24 (Screw) ×4 IMPLANT
SCREW LOCK CORT STAR 3.5X26 (Screw) ×4 IMPLANT
SCREW LOCK CORT STAR 3.5X60 (Screw) ×4 IMPLANT
SCREW LOCK CORT STAR 3.5X65 (Screw) ×4 IMPLANT
SCREW LOCK CORT STAR 3.5X70 (Screw) ×12 IMPLANT
SCREW LP 3.5X65MM (Screw) ×4 IMPLANT
SCREW LP 3.5X70MM (Screw) ×4 IMPLANT
SET IRRIG Y TYPE TUR BLADDER L (SET/KITS/TRAYS/PACK) ×4 IMPLANT
SPLINT PLASTER CAST XFAST 5X30 (CAST SUPPLIES) ×2 IMPLANT
SPLINT PLASTER XFAST SET 5X30 (CAST SUPPLIES) ×2
SPONGE GAUZE 4X4 12PLY STER LF (GAUZE/BANDAGES/DRESSINGS) ×4 IMPLANT
SPONGE LAP 18X18 X RAY DECT (DISPOSABLE) ×4 IMPLANT
STAPLER VISISTAT 35W (STAPLE) ×4 IMPLANT
STOCKINETTE IMPERVIOUS LG (DRAPES) ×4 IMPLANT
SUCTION FRAZIER HANDLE 10FR (MISCELLANEOUS) ×2
SUCTION TUBE FRAZIER 10FR DISP (MISCELLANEOUS) ×2 IMPLANT
SUT ETHILON 3 0 PS 1 (SUTURE) ×4 IMPLANT
SUT PROLENE 0 CT 1 30 (SUTURE) ×8 IMPLANT
SUT PROLENE 0 CT 2 (SUTURE) ×8 IMPLANT
SUT VIC AB 0 CT1 27 (SUTURE) ×2
SUT VIC AB 0 CT1 27XBRD ANBCTR (SUTURE) ×2 IMPLANT
SUT VIC AB 1 CT1 27 (SUTURE) ×2
SUT VIC AB 1 CT1 27XBRD ANBCTR (SUTURE) ×2 IMPLANT
SUT VIC AB 2-0 CT1 27 (SUTURE) ×4
SUT VIC AB 2-0 CT1 TAPERPNT 27 (SUTURE) ×4 IMPLANT
SYR 20ML ECCENTRIC (SYRINGE) IMPLANT
SYR BULB IRRIGATION 50ML (SYRINGE) ×4 IMPLANT
TOWEL OR 17X24 6PK STRL BLUE (TOWEL DISPOSABLE) ×4 IMPLANT
TOWEL OR 17X26 10 PK STRL BLUE (TOWEL DISPOSABLE) ×8 IMPLANT
TRAY FOLEY CATH 16FRSI W/METER (SET/KITS/TRAYS/PACK) IMPLANT
TUBE CONNECTING 12'X1/4 (SUCTIONS) ×1
TUBE CONNECTING 12X1/4 (SUCTIONS) ×3 IMPLANT
WATER STERILE IRR 1000ML POUR (IV SOLUTION) ×8 IMPLANT
YANKAUER SUCT BULB TIP NO VENT (SUCTIONS) ×4 IMPLANT

## 2015-08-11 NOTE — Anesthesia Preprocedure Evaluation (Signed)
Anesthesia Evaluation  Patient identified by MRN, date of birth, ID band Patient awake    Reviewed: Allergy & Precautions, H&P , NPO status , Patient's Chart, lab work & pertinent test results  History of Anesthesia Complications Negative for: history of anesthetic complications  Airway Mallampati: II  TM Distance: >3 FB Neck ROM: full    Dental no notable dental hx.    Pulmonary neg pulmonary ROS,    Pulmonary exam normal breath sounds clear to auscultation       Cardiovascular negative cardio ROS Normal cardiovascular exam Rhythm:regular Rate:Normal     Neuro/Psych Seizures -, Well Controlled,     GI/Hepatic negative GI ROS, Neg liver ROS,   Endo/Other  negative endocrine ROS  Renal/GU negative Renal ROS     Musculoskeletal   Abdominal   Peds  Hematology negative hematology ROS (+)   Anesthesia Other Findings   Reproductive/Obstetrics negative OB ROS                             Anesthesia Physical Anesthesia Plan  ASA: II  Anesthesia Plan: General   Post-op Pain Management:    Induction: Intravenous  Airway Management Planned: Oral ETT  Additional Equipment:   Intra-op Plan:   Post-operative Plan: Extubation in OR  Informed Consent: I have reviewed the patients History and Physical, chart, labs and discussed the procedure including the risks, benefits and alternatives for the proposed anesthesia with the patient or authorized representative who has indicated his/her understanding and acceptance.   Dental Advisory Given  Plan Discussed with: Anesthesiologist, CRNA and Surgeon  Anesthesia Plan Comments:         Anesthesia Quick Evaluation

## 2015-08-11 NOTE — Progress Notes (Signed)
Orthopedic Tech Progress Note Patient Details:  Randall HissMagdalene Sferrazza 04/21/1985 [id number]  Ortho Devices Type of Ortho Device: Post (short leg) splint, Knee Immobilizer Ortho Device/Splint Location: applied ohf to bed  Ortho Device/Splint Interventions: Ordered, Application   Jennye MoccasinHughes, Vansh Reckart Craig 08/11/2015, 4:28 PM
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
## 2015-08-11 NOTE — Anesthesia Procedure Notes (Signed)
Procedure Name: Intubation Date/Time: 08/11/2015 8:24 AM Performed by: Gavin PoundLOWDER, Delores Thelen J Pre-anesthesia Checklist: Patient identified, Emergency Drugs available, Suction available, Patient being monitored and Timeout performed Patient Re-evaluated:Patient Re-evaluated prior to inductionOxygen Delivery Method: Circle system utilized Preoxygenation: Pre-oxygenation with 100% oxygen Intubation Type: IV induction Ventilation: Mask ventilation without difficulty Laryngoscope Size: Mac and 3 Grade View: Grade I Tube type: Oral Tube size: 7.0 mm Number of attempts: 1 Placement Confirmation: ETT inserted through vocal cords under direct vision,  positive ETCO2 and breath sounds checked- equal and bilateral Secured at: 21 cm Tube secured with: Tape Dental Injury: Teeth and Oropharynx as per pre-operative assessment
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
## 2015-08-12 ENCOUNTER — Encounter (HOSPITAL_COMMUNITY): Payer: Self-pay | Admitting: Orthopedic Surgery

## 2015-08-12 DIAGNOSIS — S91001A Unspecified open wound, right ankle, initial encounter: Secondary | ICD-10-CM

## 2015-08-12 DIAGNOSIS — S8254XA Nondisplaced fracture of medial malleolus of right tibia, initial encounter for closed fracture: Secondary | ICD-10-CM

## 2015-08-12 DIAGNOSIS — S9304XA Dislocation of right ankle joint, initial encounter: Secondary | ICD-10-CM

## 2015-08-12 HISTORY — DX: Unspecified open wound, right ankle, initial encounter: S91.001A

## 2015-08-12 HISTORY — DX: Nondisplaced fracture of medial malleolus of right tibia, initial encounter for closed fracture: S82.54XA

## 2015-08-12 LAB — COMPREHENSIVE METABOLIC PANEL
ALK PHOS: 37 U/L — AB (ref 38–126)
ALT: 64 U/L — ABNORMAL HIGH (ref 14–54)
ANION GAP: 8 (ref 5–15)
AST: 47 U/L — ABNORMAL HIGH (ref 15–41)
Albumin: 2.9 g/dL — ABNORMAL LOW (ref 3.5–5.0)
BUN: 7 mg/dL (ref 6–20)
CALCIUM: 8.3 mg/dL — AB (ref 8.9–10.3)
CHLORIDE: 105 mmol/L (ref 101–111)
CO2: 24 mmol/L (ref 22–32)
Creatinine, Ser: 0.63 mg/dL (ref 0.44–1.00)
GFR calc non Af Amer: >60 mL/min (ref >60)
Glucose, Bld: 105 mg/dL — ABNORMAL HIGH (ref 65–99)
POTASSIUM: 4.2 mmol/L (ref 3.5–5.1)
SODIUM: 137 mmol/L (ref 135–145)
Total Bilirubin: 0.5 mg/dL (ref 0.3–1.2)
Total Protein: 5.3 g/dL — ABNORMAL LOW (ref 6.5–8.1)

## 2015-08-12 LAB — VITAMIN D 25 HYDROXY (VIT D DEFICIENCY, FRACTURES): Vit D, 25-Hydroxy: 40.3 ng/mL (ref 30.0–100.0)

## 2015-08-12 LAB — CBC
HCT: 25.2 % — ABNORMAL LOW (ref 36.0–46.0)
HEMOGLOBIN: 8.2 g/dL — AB (ref 12.0–15.0)
MCH: 30.7 pg (ref 26.0–34.0)
MCHC: 32.5 g/dL (ref 30.0–36.0)
MCV: 94.4 fL (ref 78.0–100.0)
Platelets: 317 10*3/uL (ref 150–400)
RBC: 2.67 MIL/uL — AB (ref 3.87–5.11)
RDW: 13.5 % (ref 11.5–15.5)
WBC: 10.9 10*3/uL — ABNORMAL HIGH (ref 4.0–10.5)

## 2015-08-12 LAB — CALCITRIOL (1,25 DI-OH VIT D): VIT D 1 25 DIHYDROXY: 97.4 pg/mL — AB (ref 19.9–79.3)

## 2015-08-12 MED ORDER — OXYCODONE-ACETAMINOPHEN 5-325 MG PO TABS: 1.0000 | ORAL_TABLET | Freq: Four times a day (QID) | ORAL | Status: AC | PRN

## 2015-08-12 MED ORDER — ENOXAPARIN (LOVENOX) PATIENT EDUCATION KIT
PACK | Freq: Once | Status: DC
  Filled 2015-08-12 (×2): qty 1

## 2015-08-12 MED ORDER — TRAMADOL HCL 50 MG PO TABS: 50.0000 mg | ORAL_TABLET | Freq: Four times a day (QID) | ORAL | Status: AC | PRN

## 2015-08-12 MED ORDER — METHOCARBAMOL 500 MG PO TABS: 500.0000 mg | ORAL_TABLET | Freq: Four times a day (QID) | ORAL | Status: AC

## 2015-08-12 MED ORDER — POLYETHYLENE GLYCOL 3350 17 G PO PACK: 17.0000 g | PACK | Freq: Every day | ORAL | Status: AC

## 2015-08-12 MED ORDER — ENOXAPARIN (LOVENOX) PATIENT EDUCATION KIT: 1.0000 | PACK | Freq: Once | Status: AC

## 2015-08-12 MED ORDER — ENOXAPARIN SODIUM 40 MG/0.4ML ~~LOC~~ SOLN: 40.0000 mg | SUBCUTANEOUS | Status: AC

## 2015-08-12 MED ORDER — DOCUSATE SODIUM 100 MG PO CAPS: 100.0000 mg | ORAL_CAPSULE | Freq: Two times a day (BID) | ORAL | Status: AC

## 2015-08-12 MED ORDER — TRAMADOL HCL 50 MG PO TABS: 50.0000 mg | ORAL_TABLET | Freq: Four times a day (QID) | ORAL | Status: DC | PRN

## 2015-08-12 NOTE — Discharge Instructions (Signed)
Orthopaedic Trauma Service Discharge Instructions   General Discharge Instructions  WEIGHT BEARING STATUS: Nonweightbearing Right Leg   RANGE OF MOTION/ACTIVITY: unrestricted range of motion right knee. Hinged brace only needs to be on when mobilizing. Do not remove splint. No pillows under knee. To elevate leg place pillows under ankle to help keep knee straight or use bone foam   Wound Care: keep dressings clean and dry. Keep prevena vac battery charged. Monitor canister   PAIN MEDICATION USE AND EXPECTATIONS  You have likely been given narcotic medications to help control your pain.  After a traumatic event that results in an fracture (broken bone) with or without surgery, it is ok to use narcotic pain medications to help control one's pain.  We understand that everyone responds to pain differently and each individual patient will be evaluated on a regular basis for the continued need for narcotic medications. Ideally, narcotic medication use should last no more than 6-8 weeks (coinciding with fracture healing).   As a patient it is your responsibility as well to monitor narcotic medication use and report the amount and frequency you use these medications when you come to your office visit.   We would also advise that if you are using narcotic medications, you should take a dose prior to therapy to maximize you participation.  IF YOU ARE ON NARCOTIC MEDICATIONS IT IS NOT PERMISSIBLE TO OPERATE A MOTOR VEHICLE (MOTORCYCLE/CAR/TRUCK/MOPED) OR HEAVY MACHINERY DO NOT MIX NARCOTICS WITH OTHER CNS (CENTRAL NERVOUS SYSTEM) DEPRESSANTS SUCH AS ALCOHOL  Diet: as you were eating previously.  Can use over the counter stool softeners and bowel preparations, such as Miralax, to help with bowel movements.  Narcotics can be constipating.  Be sure to drink plenty of fluids    STOP SMOKING OR USING NICOTINE PRODUCTS!!!!  As discussed nicotine severely impairs your body's ability to heal surgical and  traumatic wounds but also impairs bone healing.  Wounds and bone heal by forming microscopic blood vessels (angiogenesis) and nicotine is a vasoconstrictor (essentially, shrinks blood vessels).  Therefore, if vasoconstriction occurs to these microscopic blood vessels they essentially disappear and are unable to deliver necessary nutrients to the healing tissue.  This is one modifiable factor that you can do to dramatically increase your chances of healing your injury.    (This means no smoking, no nicotine gum, patches, etc)  DO NOT USE NONSTEROIDAL ANTI-INFLAMMATORY DRUGS (NSAID'S)  Using products such as Advil (ibuprofen), Aleve (naproxen), Motrin (ibuprofen) for additional pain control during fracture healing can delay and/or prevent the healing response.  If you would like to take over the counter (OTC) medication, Tylenol (acetaminophen) is ok.  However, some narcotic medications that are given for pain control contain acetaminophen as well. Therefore, you should not exceed more than 4000 mg of tylenol in a day if you do not have liver disease.  Also note that there are may OTC medicines, such as cold medicines and allergy medicines that my contain tylenol as well.  If you have any questions about medications and/or interactions please ask your doctor/PA or your pharmacist.      ICE AND ELEVATE INJURED/OPERATIVE EXTREMITY  Using ice and elevating the injured extremity above your heart can help with swelling and pain control.  Icing in a pulsatile fashion, such as 20 minutes on and 20 minutes off, can be followed.    Do not place ice directly on skin. Make sure there is a barrier between to skin and the ice pack.  Using frozen items such as frozen peas works well as the conform nicely to the are that needs to be iced.  USE AN ACE WRAP OR TED HOSE FOR SWELLING CONTROL  In addition to icing and elevation, Ace wraps or TED hose are used to help limit and resolve swelling.  It is recommended to use  Ace wraps or TED hose until you are informed to stop.    When using Ace Wraps start the wrapping distally (farthest away from the body) and wrap proximally (closer to the body)   Example: If you had surgery on your leg or thing and you do not have a splint on, start the ace wrap at the toes and work your way up to the thigh        If you had surgery on your upper extremity and do not have a splint on, start the ace wrap at your fingers and work your way up to the upper arm  IF YOU ARE IN A SPLINT OR CAST DO NOT REMOVE IT FOR ANY REASON   If your splint gets wet for any reason please contact the office immediately. You may shower in your splint or cast as long as you keep it dry.  This can be done by wrapping in a cast cover or garbage back (or similar)  Do Not stick any thing down your splint or cast such as pencils, money, or hangers to try and scratch yourself with.  If you feel itchy take benadryl as prescribed on the bottle for itching  IF YOU ARE IN A CAM BOOT (BLACK BOOT)  You may remove boot periodically. Perform daily dressing changes as noted below.  Wash the liner of the boot regularly and wear a sock when wearing the boot. It is recommended that you sleep in the boot until told otherwise  CALL THE OFFICE WITH ANY QUESTIONS OR CONCERNS: [phone]

## 2015-08-12 NOTE — Progress Notes (Signed)
Orthopedic Tech Progress Note Patient Details:  Cassandra Price 04/06/1985 [id number]  Patient ID: Cassandra Price, female   DOB: 01/01/1986, 30 y.o.   MRN: [id number]   [name] 08/12/2015, 9:47 [facility] right Hinged knee brace.

## 2015-08-12 NOTE — Progress Notes (Signed)
Orthopedic Tech Progress Note Patient Details:  Cassandra Price 03/26/1985 [id number]  Ortho Devices Type of Ortho Device: Post (short leg) splint, Knee Immobilizer Ortho Device/Splint Location: Foot roll Ortho Device/Splint Interventions: Ordered, Application   Saul FordyceJennifer C Kaysea Raya 08/12/2015, 11:09 AM

## 2015-08-12 NOTE — Evaluation (Signed)
Physical Therapy Evaluation Patient Details Name: Cassandra Price MRN: [id number] DOB: March 04, 1986 Today's Date: 08/12/2015   History of Present Illness  Open reduction and internal fixation of right bicondylar tibial plateau. Right knee arthrotomy with repair of lateral meniscus Anterior compartment fasciotomy, right leg.Arthrotomy, right ankle, with excisionally debridement of skin subcutaneous and fascia as well as portions of torn capsule.Open treatment of right ankle dislocation.Closed treatment of medial malleolus, right ankle.Application of small wound VAC right ankle traumatic wound.  Clinical Impression  Patient is s/p above surgery resulting in functional limitations due to the deficits listed below (see PT Problem List).  Patient will benefit from skilled PT to increase their independence and safety with mobility to allow discharge to home with family support. Pt able to ambulate 65 feet with rw and min guard assist (+2 for assist with lines and chair follow). Pt motivated to mobilize and get home.       Follow Up Recommendations Home health PT;Supervision for mobility/OOB    Equipment Recommendations  Rolling walker with 5" wheels;3in1 (PT)    Recommendations for Other Services       Precautions / Restrictions Precautions Precautions: Fall Precaution Comments: wound vac which she will be going home with Required Braces or Orthoses: Other Brace/Splint Other Brace/Splint: bledsoe brace--unlocked, can have off when not mobilizing Restrictions Weight Bearing Restrictions: Yes RLE Weight Bearing: Non weight bearing      Mobility  Bed Mobility Overal bed mobility: Needs Assistance Bed Mobility: Supine to Sit     Supine to sit: HOB elevated;Min guard     General bed mobility comments: Min guard with Rt LE, using rail to assist  Transfers Overall transfer level: Needs assistance Equipment used: Rolling walker (2 wheeled) Transfers: Sit to/from Stand Sit to Stand: Min  guard        General transfer comment: cues for hand placement and reminder for NWB.   Ambulation/Gait Ambulation/Gait assistance: Min guard;+2 safety/equipment Ambulation Distance (Feet): 65 Feet Assistive device: Rolling walker (2 wheeled) Gait Pattern/deviations:  (swing-to pattern) Gait velocity: decreased   General Gait Details: No loss of balance, 2 standing breaks due to fatigue. Assist with lilnes and chair follow.   Stairs            Wheelchair Mobility    Modified Rankin (Stroke Patients Only)       Balance Overall balance assessment: Needs assistance Sitting-balance support: No upper extremity supported Sitting balance-Leahy Scale: Good     Standing balance support: Bilateral upper extremity supported Standing balance-Leahy Scale: Poor Standing balance comment: using rw for support                             Pertinent Vitals/Pain Pain Assessment: 0-10 Pain Score: 6  Pain Location: Rt LE Pain Descriptors / Indicators: Aching Pain Intervention(s): Limited activity within patient's tolerance;Monitored during session    Home Living Family/patient expects to be discharged to:: Private residence Living Arrangements: Spouse/significant other Available Help at Discharge: Family;Personal care attendant;Available 24 hours/day (trying to arrange [facility] through company her uncle owens) Type of Home: House Home Access: Stairs to enter Entrance Stairs-Rails: None Entrance Stairs-Number of Steps: 1 Home Layout: One level Home Equipment: None Additional Comments: Lives in [place] with her boyfriend    Prior Function Level of Independence: Independent               Hand Dominance       Extremity/Trunk Assessment   Upper Extremity Assessment:  Overall WFL for tasks assessed           Lower Extremity Assessment: RLE deficits/detail RLE Deficits / Details: poor quad activation       Communication   Communication: No  difficulties  Cognition Arousal/Alertness: Awake/alert Behavior During Therapy: WFL for tasks assessed/performed Overall Cognitive Status: Within Functional Limits for tasks assessed                      General Comments      Exercises Total Joint Exercises Quad Sets: Right;Strengthening;10 reps Gluteal Sets: Strengthening;Both;10 reps Heel Slides: AAROM;Right;10 reps Straight Leg Raises: Strengthening;Right;5 reps (mod assist)      Assessment/Plan    PT Assessment Patient needs continued PT services  PT Diagnosis Difficulty walking   PT Problem List Decreased strength;Decreased range of motion;Decreased activity tolerance;Decreased balance;Decreased mobility  PT Treatment Interventions DME instruction;Gait training;Stair training;Functional mobility training;Therapeutic activities;Therapeutic exercise;Patient/family education   PT Goals (Current goals can be found in the Care Plan section) Acute Rehab PT Goals Patient Stated Goal: get out of the hospital PT Goal Formulation: With patient Time For Goal Achievement: 08/26/15 Potential to Achieve Goals: Good    Frequency Min 5X/week   Barriers to discharge        Co-evaluation               End of Session Equipment Utilized During Treatment: Gait belt;Other (comment) (Rt bledsoe brace) Activity Tolerance: Patient tolerated treatment well Patient left: in chair;with call bell/phone within reach (in knee extension) Nurse Communication: Mobility status;Weight bearing status         Time: [id number] PT Time Calculation (min) (ACUTE ONLY): 24 min   Charges:   PT Evaluation $PT Eval Moderate Complexity: 1 Procedure PT Treatments $Gait Training: 8-22 mins   PT G Codes:        Christiane HaBenjamin J. Rebeccah Ivins, PT, CSCS Pager [phone] Office [phone]  08/12/2015, 3:44 PM
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
## 2015-08-12 NOTE — Evaluation (Signed)
Occupational Therapy Evaluation Patient Details Name: Cassandra Price MRN: [id number] DOB: January 22, 1986 Today's Date: 08/12/2015    History of Present Illness Open reduction and internal fixation of right bicondylar tibial plateau. Right knee arthrotomy with repair of lateral meniscus Anterior compartment fasciotomy, right leg.Arthrotomy, right ankle, with excisionally debridement of skin subcutaneous and fascia as well as portions of torn capsule.Open treatment of right ankle dislocation.Closed treatment of medial malleolus, right ankle.Application of small wound VAC right ankle traumatic wound.   Clinical Impression   This 30 yo female admitted and underwent above presents to acute OT with deficits below (see OT problem list) thus affecting her PLOF of independent with basic and IADLs.  She will benefit from acute OT with follow up HHOT to make sure she gets to a Mod I to independent level in her own home.    Follow Up Recommendations  Home health OT    Equipment Recommendations  3 in 1 bedside comode       Precautions / Restrictions Precautions Precautions: Fall Precaution Comments: wound vac which she will be going home with Required Braces or Orthoses: Other Brace/Splint Other Brace/Splint: bledsoe brace--inlocked, can have on when in bed Restrictions Weight Bearing Restrictions: Yes RLE Weight Bearing: Non weight bearing      Mobility Bed Mobility Overal bed mobility: Needs Assistance Bed Mobility: Supine to Sit     Supine to sit: Min assist;HOB elevated     General bed mobility comments: A for RLE  Transfers Overall transfer level: Needs assistance Equipment used: Rolling walker (2 wheeled) Transfers: Sit to/from [employer] Sit to Stand: Min assist Stand pivot transfers: Min assist            Balance Overall balance assessment: Needs assistance Sitting-balance support: No upper extremity supported;Feet supported Sitting balance-Leahy Scale:  Good     Standing balance support: Bilateral upper extremity supported;During functional activity Standing balance-Leahy Scale: Poor Standing balance comment: reliant on RW for support when up on her LLE                            ADL Overall ADL's : Needs assistance/impaired Eating/Feeding: Independent;Sitting   Grooming: Set up;Sitting   Upper Body Bathing: Set up;Sitting   Lower Body Bathing: Minimal assistance;Sit to/from stand   Upper Body Dressing : Set up;Sitting   Lower Body Dressing: Moderate assistance (min A sit<>stand)   Toilet Transfer: Minimal assistance;Stand-pivot;RW (bed>recliner going to her right with mainly A for her RLE)   Toileting- Clothing Manipulation and Hygiene: Minimal assistance;Sit to/from stand                         Pertinent Vitals/Pain Pain Assessment: 0-10 Pain Score: 7  Pain Location: RLE post movement Pain Descriptors / Indicators: Aching;Burning;Sore;Guarding Pain Intervention(s): Limited activity within patient's tolerance;Monitored during session;Repositioned;PCA encouraged     Hand Dominance Right   Extremity/Trunk Assessment Upper Extremity Assessment Upper Extremity Assessment: Overall WFL for tasks assessed           Communication Communication Communication: No difficulties   Cognition Arousal/Alertness: Awake/alert Behavior During Therapy: WFL for tasks assessed/performed Overall Cognitive Status: Within Functional Limits for tasks assessed                                Home Living Family/patient expects to be discharged to:: Private residence Living Arrangements:  (boyfriend and  others) Available Help at Discharge: Family;Personal care attendant;Available 24 hours/day (trying to arrange [employer] through company her uncle owens) Type of Home: House Home Access: Level entry (just a threshold)     Home Layout: One level     Bathroom Shower/Tub: Tub/shower unit;Curtain Shower/tub  characteristics: Engineer, building servicesCurtain Bathroom Toilet: Standard     Home Equipment: None   Additional Comments: [place]      Prior Functioning/Environment Level of Independence: Independent             OT Diagnosis: Generalized weakness;Acute pain   OT Problem List: Decreased strength;Decreased range of motion;Impaired balance (sitting and/or standing);Pain;Decreased knowledge of use of DME or AE   OT Treatment/Interventions: Self-care/ADL training;Patient/family education;Balance training;Therapeutic activities;DME and/or AE instruction    OT Goals(Current goals can be found in the care plan section) Acute Rehab OT Goals Patient Stated Goal: [place]  OT Goal Formulation: With patient Time For Goal Achievement: 08/19/15 Potential to Achieve Goals: Good  OT Frequency: Min 2X/week              End of Session Equipment Utilized During Treatment: Gait belt;Rolling walker;Right knee immobilizer (bledsoe brace not there at beginning of session, arrived at end of session and applied by [employer] tech) Nurse Communication: Mobility status;Precautions;Weight bearing status (NT; also asked RN about the vac since I did not see anything draining--RN reports that it has not drained since surgery.)  Activity Tolerance: Patient tolerated treatment well Patient left: in chair;with call bell/phone within reach;with chair alarm set   Time: 1610-96041057-1128 OT Time Calculation (min): 31 min Charges:  OT General Charges $OT Visit: 1 Procedure OT Evaluation $OT Eval Low Complexity: 1 Procedure OT Treatments $Self Care/Home Management : 8-22 mins  Cassandra GeorgesLeonard, Cassandra Price [phone] 08/12/2015, 12:03 PM

## 2015-08-12 NOTE — Op Note (Signed)
Cassandra Price, Cassandra Price NO.:  [id number]  MEDICAL RECORD NO.:  [id number]  LOCATION:  5N27C                        FACILITY:  [facility]  PHYSICIAN:  Cassandra Price, M.D. DATE OF BIRTH:  1985/11/08  DATE OF PROCEDURE:  08/11/2015 DATE OF DISCHARGE:                              OPERATIVE REPORT   PREOPERATIVE DIAGNOSES: 1. Right bicondylar tibial plateau fracture. 2. Right medial malleolus fracture.  POSTOPERATIVE DIAGNOSES: 1. Right bicondylar tibial plateau fracture. 2. Complete lateral detachment from the periphery. 3. Open right ankle dislocation. 4. Right medial malleolus fracture.  PROCEDURES: 1. Open reduction and internal fixation of right bicondylar tibial     plateau. 2. Right knee arthrotomy with repair of lateral meniscus using 10     vertical mattress sutures. 3. Anterior compartment fasciotomy, right leg. 4. Arthrotomy, right ankle, with excisionally debridement of skin     subcutaneous and fascia as well as portions of torn capsule. 5. Open treatment of right ankle dislocation. 6. Closed treatment of medial malleolus, right ankle. 7. Application of small wound VAC right ankle traumatic wound. 8. Complex retention suture closure 6 cm.  SURGEON:  Cassandra Price, M.D.  ASSISTANTS: 1. Cassandra Latin, PA-C. 2. PA student.  ANESTHESIA:  General.  COMPLICATIONS:  None.  TOURNIQUET:  None.  I/O:  1200 mL crystalloid.  Urinary output is pending with I and O catheter.  ESTIMATED BLOOD LOSS:  150 mL.  DISPOSITION:  To PACU.  CONDITION:  Stable.  BRIEF SUMMARY AND INDICATION FOR PROCEDURE:  Cassandra Price is a very pleasant 30 year old female with a right split depressed bicondylar plateau fracture and a right ankle medial malleolus fracture with a reported lateral abrasion.  Discussed with her the risks and benefits of surgical repair including the difficulty of this particular pattern with a severe impaction along the joint line, and  the patient had soft tissue swelling that precluded earlier repair.  She was initially evaluated by Dr. Lajoyce Corners who recognized the difficulty and complexity of her combined injuries and felt that this was outside the scope of his practice and these injuries would be best managed by fellowship trained orthopedic traumatologist.  Consequently, he consulted me urgently to assist in further evaluation and management.  Risks included arthritis, malunion, nonunion, DVT, PE, loss of motion, need for further surgery, and multiple others.  She wished to proceed.  BRIEF SUMMARY OF PROCEDURE:  The patient was taken to the operating room after administration of preoperative antibiotics.  Right lower extremity was prepped and draped in usual sterile fashion.  No tourniquet was used about the thigh.  During the prepping, the patient was found to have considerable oozing and bleeding from the right ankle.  Palpation and a very cursory evaluation revealed complete disruption of the capsule with instability of the joint, visibility of the ATFL, and again no soft tissue coverage of the joint laterally.  Following standard prep and drape, we isolated the right ankle and extended the incision proximally. I would eventually use the 10 blade to excise the wound edges which were severely abraded as well as underlying contaminated subcutaneous tissue, muscle, fascia, and capsule.  I irrigated the ankle joint thoroughly by extending the arthrotomy  slightly and fixating range of motion throughout 6000 mL of saline.  This was followed by repair of the capsule with 0 PDS, 2-0 PDS for the subcu, and then retention far-near- near-far sutures for the traumatic wound which was approximately 6 cm. New drapes and attire were applied.  Attention was then turned to my assistant, Cassandra Price and the PA student who assisted me throughout with retraction during evacuation of the joint as well as to facilitate range of motion  during the I and D.  Attention was then turned proximally to the knee where a standard curvilinear anterolateral incision was made.  Dissection was carried down on the anterior tibia distally and proximally an arthrotomy was made proximal to the joint line.  The coronary ligament was incised to elevate the deep joint capsule revealing complete detachment of the lateral meniscus from the anterior horn all the way to the posterior horn.  It was also torn along the edge slightly in the mid body.  I cleaned the fracture site as well as hematoma that collected around the periphery of the meniscus.  I then placed at least 9 Prolene FiberWire vertical mattress sutures securing the meniscus and approximating it securely to allow for healing.  My assistant was required to pull distraction and varus to facilitate this repair.  My other assistant held tension on the sutures to mobilize the meniscus.  Once this was complete, I continued with the curettage of the fracture segments and evaluation of the joint.  There were numerous pulverized and depressed segments of articular joint that were not reconstructible.  There was only one large articular piece of subchondral bone that had been depressed in a way that I was able to preserve for repair.  This was carefully protected on the back table.  While the procedure proceeded, I used a lamina spreader to distract the fragment.  I did leave the retinaculum attached along the lateral rim for blood supply elevating distally and then being able to mobilize through the incision of the coronary ligament proximally.  I did shave some cancellous bone of the metaphysis to facilitate closure of the joint space where the fragments were absent and to use as additional bone graft underneath this very small segment of bone for more rapid incorporation and to prevent this segment of bone from being displaced into the joint as a free fragment. The chondral piece was  pinned to the medial side which because of the pattern of depression was really not capable of being elevated separately, and consequently this piece was either to match with the medial side underneath the meniscus and anterior to the primary weightbearing area or it was to stick up and have a significant amount of stress placed upon it at the elevation of the lateral side.  This resulted in about a 2 mm step-off which I accepted for the stability as I was able to restore condylar width, have appropriate alignment repair of meniscus, and have an overall excellent articular reduction in all areas except for this one segment.  Following placement of the Biomet plate which had additional compression placed by using the [facility] clamp, I also assessed the ligaments which showed outstanding stability in full extension and 30 degrees of flexion.  The patient's knee did go into recurvatum on the other side, and so I was careful to evaluate the alignment with the knee in the same degree of extension.  Two standard screws were placed, one most anterior and one most posteriorly at  the joint line.  Standard screws were placed distally in the metaphysis, and then additional lock fixation placed both proximally and distally. Final images were obtained.  Once the bone had been completely repaired, I chose to perform an anterior compartment fasciotomy because of the early nature of this reconstruction and the potential for compartment syndrome.  I went to the distal edge of the wound and with the 10-cm long Metzenbaum scissors spread both superficial and deep to the anterior compartment fascia, and then with the scissors tips pointed away from the superficial peroneal nerve.  I moved the scissors distally and released the compartment.  All wounds were irrigated thoroughly and then closed in standard layered fashion with #1 Vicryl for the retinaculum, 2-0 Vicryl, and 3-0 nylon. Sterile gently  compressive dressing was applied.  Additionally, because of the traumatic wound and the delayed nature of the discovery of the extent of her ankle injury through the traumatic wound, we chose to place a small wound VAC over the loosely approximated wound and then repair with the retention sutures.  A layer of Mepitel was applied and then the Prevena wound VAC, followed by application of the splint and sterile gently compressive dressing from foot to thigh.  There were no complications.  Cassandra MoritaKeith Paul and the PA did assist me throughout.  PROGNOSIS:  The patient has had a severe injury to her right knee some of which was not reconstructible which definitely predisposes her to early advancement of arthritis, nonunion and malunion.  Hopefully, these factors are mitigated by restoration of alignment length, repair of the meniscus and the ligamentous stability found on examination.  We will treat the ankle closed because of the severity of the comminution both in the sagittal and coronal planes.  We will also add gentamicin to her postoperative antibiotics given the delayed presentation to the OR with the ankle.     Cassandra AlbinoMichael H. Carola FrostHandy, M.D.     [facility]/MEDQ  D:  08/11/2015  T:  08/12/2015  Job:  [id number]

## 2015-08-12 NOTE — Anesthesia Postprocedure Evaluation (Signed)
Anesthesia Post Note  Patient: Randall HissMagdalene Stolp  Procedure(s) Performed: Procedure(s) (LRB): OPEN REDUCTION INTERNAL FIXATION (ORIF) TIBIAL PLATEAU (Right) INCISION AND DRAINAGE ANKLE (Right)  Patient location during evaluation: PACU Anesthesia Type: General Level of consciousness: awake and alert Pain management: pain level controlled Vital Signs Assessment: post-procedure vital signs reviewed and stable Respiratory status: spontaneous breathing, nonlabored ventilation, respiratory function stable and patient connected to nasal cannula oxygen Cardiovascular status: blood pressure returned to baseline and stable Postop Assessment: no signs of nausea or vomiting Anesthetic complications: no                 Reino KentJudd, Danaly Bari J

## 2015-08-13 LAB — COMPREHENSIVE METABOLIC PANEL
ALT: 85 U/L — AB (ref 14–54)
AST: 55 U/L — ABNORMAL HIGH (ref 15–41)
Albumin: 3.1 g/dL — ABNORMAL LOW (ref 3.5–5.0)
Alkaline Phosphatase: 42 U/L (ref 38–126)
Anion gap: 10 (ref 5–15)
BILIRUBIN TOTAL: 1 mg/dL (ref 0.3–1.2)
BUN: 6 mg/dL (ref 6–20)
CALCIUM: 8.5 mg/dL — AB (ref 8.9–10.3)
CHLORIDE: 102 mmol/L (ref 101–111)
CO2: 24 mmol/L (ref 22–32)
CREATININE: 0.7 mg/dL (ref 0.44–1.00)
Glucose, Bld: 107 mg/dL — ABNORMAL HIGH (ref 65–99)
Potassium: 3.8 mmol/L (ref 3.5–5.1)
Sodium: 136 mmol/L (ref 135–145)
TOTAL PROTEIN: 6.1 g/dL — AB (ref 6.5–8.1)

## 2015-08-13 LAB — CBC
HCT: 28.8 % — ABNORMAL LOW (ref 36.0–46.0)
Hemoglobin: 9.1 g/dL — ABNORMAL LOW (ref 12.0–15.0)
MCH: 30.2 pg (ref 26.0–34.0)
MCHC: 31.6 g/dL (ref 30.0–36.0)
MCV: 95.7 fL (ref 78.0–100.0)
PLATELETS: 340 10*3/uL (ref 150–400)
RBC: 3.01 MIL/uL — AB (ref 3.87–5.11)
RDW: 13.6 % (ref 11.5–15.5)
WBC: 10.7 10*3/uL — AB (ref 4.0–10.5)
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
## 2015-08-13 NOTE — Progress Notes (Signed)
Physical Therapy Treatment Patient Details Name: Cassandra Price MRN: [id number] DOB: 12-16-1985 Today's Date: 08/13/2015    History of Present Illness Open reduction and internal fixation of right bicondylar tibial plateau. Right knee arthrotomy with repair of lateral meniscus Anterior compartment fasciotomy, right leg.Arthrotomy, right ankle, with excisionally debridement of skin subcutaneous and fascia as well as portions of torn capsule.Open treatment of right ankle dislocation.Closed treatment of medial malleolus, right ankle.Application of small wound VAC right ankle traumatic wound.    PT Comments    Pt is making steady progress with mobility. Demo'd good balance with use of RW and has used crutches in past. Would be beneficial to trial these at her next session as they would allow her to access more tight spaces that RW if her balance allows. Pt also has a level entry way unless going through garage. Then she has a single step (~4 inches high). Will plan to practice this in room next session to allow her more option with entering/exiting her home. Limited today due to waiting on IV team to restart her IV so her RN can restart medications, including her pain medication.    Follow Up Recommendations  Home health PT;Supervision for mobility/OOB     Equipment Recommendations  Rolling walker with 5" wheels;3in1 (PT)    Precautions / Restrictions Precautions Precautions: Fall Precaution Comments: wound vac which she will be going home with Required Braces or Orthoses: Other Brace/Splint Other Brace/Splint: bledsoe brace--unlocked, can have off when not mobilizing Restrictions RLE Weight Bearing: Non weight bearing    Mobility  Bed Mobility   Bed Mobility: Supine to Sit     Supine to sit: HOB elevated;Min guard     General bed mobility comments: bed flat  and no rails used. assist for right LE movement/support with movement  Transfers Overall transfer level: Needs  assistance Equipment used: Rolling walker (2 wheeled) Transfers: Sit to/from Stand Sit to Stand: Min guard         General transfer comment: no cues or assistance needed, min guard for safety only  Ambulation/Gait Ambulation/Gait assistance: Min guard;Supervision Ambulation Distance (Feet): 30 Feet Assistive device: Rolling walker (2 wheeled) Gait Pattern/deviations:  (swing to gait pattern) Gait velocity: decreased Gait velocity interpretation: Below normal speed for age/gender General Gait Details: great balance, maintains NWB on right leg. limited distance in room due to waiting on IV team to restart IV (call has been placed by RN).       Cognition Arousal/Alertness: Awake/alert Behavior During Therapy: WFL for tasks assessed/performed Overall Cognitive Status: Within Functional Limits for tasks assessed                       Pertinent Vitals/Pain Pain Assessment: 0-10 Pain Score: 8  Pain Location: right leg Pain Descriptors / Indicators: Aching;Sore Pain Intervention(s): Limited activity within patient's tolerance;Monitored during session;Premedicated before session;Repositioned     PT Goals (current goals can now be found in the care plan section) Acute Rehab PT Goals Patient Stated Goal: get out of the hospital PT Goal Formulation: With patient Time For Goal Achievement: 08/26/15 Potential to Achieve Goals: Good Progress towards PT goals: Progressing toward goals    Frequency  Min 5X/week    PT Plan Current plan remains appropriate    End of Session Equipment Utilized During Treatment: Gait belt (right bledsoe brace) Activity Tolerance: Patient tolerated treatment well Patient left: in chair;with call bell/phone within reach     Time: 1116-1140 PT Time Calculation (min) (ACUTE  ONLY): 24 min  Charges:  $Gait Training: 8-22 mins $Therapeutic Activity: 8-22 mins           Sallyanne KusterBury, Danila Eddie 08/13/2015, 11:46 AM   Sallyanne KusterKathy Raffaele Derise, PTA, CLT Acute Rehab  Services [phone] 08/13/2015, 11:46 AM
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
## 2015-08-14 LAB — COMPREHENSIVE METABOLIC PANEL
ALT: 80 U/L — AB (ref 14–54)
ANION GAP: 9 (ref 5–15)
AST: 48 U/L — ABNORMAL HIGH (ref 15–41)
Albumin: 2.9 g/dL — ABNORMAL LOW (ref 3.5–5.0)
Alkaline Phosphatase: 45 U/L (ref 38–126)
BUN: 8 mg/dL (ref 6–20)
CHLORIDE: 103 mmol/L (ref 101–111)
CO2: 24 mmol/L (ref 22–32)
Calcium: 8.6 mg/dL — ABNORMAL LOW (ref 8.9–10.3)
Creatinine, Ser: 0.58 mg/dL (ref 0.44–1.00)
GFR calc non Af Amer: >60 mL/min (ref >60)
Glucose, Bld: 81 mg/dL (ref 65–99)
Potassium: 4.5 mmol/L (ref 3.5–5.1)
SODIUM: 136 mmol/L (ref 135–145)
Total Bilirubin: 0.8 mg/dL (ref 0.3–1.2)
Total Protein: 5.8 g/dL — ABNORMAL LOW (ref 6.5–8.1)

## 2015-08-14 LAB — CBC
HEMATOCRIT: 26.3 % — AB (ref 36.0–46.0)
HEMOGLOBIN: 8.2 g/dL — AB (ref 12.0–15.0)
MCH: 29.7 pg (ref 26.0–34.0)
MCHC: 31.2 g/dL (ref 30.0–36.0)
MCV: 95.3 fL (ref 78.0–100.0)
Platelets: 315 10*3/uL (ref 150–400)
RBC: 2.76 MIL/uL — AB (ref 3.87–5.11)
RDW: 13.6 % (ref 11.5–15.5)
WBC: 7.6 10*3/uL (ref 4.0–10.5)

## 2015-08-14 NOTE — Progress Notes (Signed)
Orthopedic Tech Progress Note Patient Details:  Randall HissMagdalene Howry 11/13/1985 [id number]  Ortho Devices Type of Ortho Device: Crutches Ortho Device/Splint Location: Foot roll Ortho Device/Splint Interventions: Application   Nikki DomCrawford, Shalay Carder 08/14/2015, 12:24 PM

## 2015-08-14 NOTE — Progress Notes (Signed)
Physical Therapy Treatment Patient Details Name: Cassandra HissMagdalene Cassaday MRN: [id number] DOB: 11/05/1985 Today's Date: 08/14/2015    History of Present Illness Open reduction and internal fixation of right bicondylar tibial plateau. Right knee arthrotomy with repair of lateral meniscus Anterior compartment fasciotomy, right leg.Arthrotomy, right ankle, with excisionally debridement of skin subcutaneous and fascia as well as portions of torn capsule.Open treatment of right ankle dislocation.Closed treatment of medial malleolus, right ankle.Application of small wound VAC right ankle traumatic wound.    PT Comments    Pt making steady progress. Used crutches today with no significant issues. For now pt to use her crutches for short household distance and walker for longer community distances due to fatigue. HHPT will continue to work with her on crutches and exercises. Patient safe to D/C from a mobility standpoint based on progression towards goals set on PT eval.    Follow Up Recommendations  Home health PT;Supervision for mobility/OOB     Equipment Recommendations  Rolling walker with 5" wheels;3in1 (PT)    Precautions / Restrictions Precautions Precautions: Fall Precaution Comments: wound vac which she will be going home with Required Braces or Orthoses: Other Brace/Splint Other Brace/Splint: bledsoe brace--unlocked, can have off when not mobilizing Restrictions Weight Bearing Restrictions: Yes RLE Weight Bearing: Non weight bearing    Mobility  Bed Mobility Overal bed mobility: Modified Independent Bed Mobility: Supine to Sit     Supine to sit: Modified independent (Device/Increase time)     General bed mobility comments: not observed: pt in recliner before and after session  Transfers Overall transfer level: Needs assistance Equipment used: Rolling walker (2 wheeled) Transfers: Sit to/from UGI CorporationStand;Stand Pivot Transfers Sit to Stand: Min guard Stand pivot transfers: Min guard        General transfer comment: no physical assistance needed, min guard for safety due to using cructhes today  Ambulation/Gait Ambulation/Gait assistance: Min guard;Min assist Ambulation Distance (Feet): 50 Feet Assistive device: Crutches Gait Pattern/deviations:  (swing through gait pattern) Gait velocity: decreased Gait velocity interpretation: Below normal speed for age/gender General Gait Details: 1 loss of balance due to crutch slipping needing min assist to correct, otherwise min guard assist for safety due to 1st time using crutches. maintained NWB       Cognition Arousal/Alertness: Awake/alert Behavior During Therapy: WFL for tasks assessed/performed Overall Cognitive Status: Within Functional Limits for tasks assessed                      Exercises General Exercises - Lower Extremity Ankle Circles/Pumps: AROM;Strengthening;Right;10 reps;Supine;Limitations Ankle Circles/Pumps Limitations: toe wiggles only  Quad Sets: AROM;Strengthening;Right;10 reps;Supine Heel Slides: AAROM;Strengthening;Right;10 reps;Supine Straight Leg Raises: AROM;Strengthening;Right;10 reps;Supine     Pertinent Vitals/Pain Pain Assessment: 0-10 Pain Score: 6  Pain Location: right leg Pain Descriptors / Indicators: Aching;Sore Pain Intervention(s): Limited activity within patient's tolerance;Monitored during session;Premedicated before session;Repositioned     PT Goals (current goals can now be found in the care plan section) Acute Rehab PT Goals Patient Stated Goal: get out of the hospital PT Goal Formulation: With patient Time For Goal Achievement: 08/26/15 Potential to Achieve Goals: Good Progress towards PT goals: Progressing toward goals    Frequency  Min 5X/week    PT Plan Current plan remains appropriate    End of Session Equipment Utilized During Treatment: Gait belt;Other (comment) (right bledsoe brace) Activity Tolerance: Patient tolerated treatment well Patient  left: in chair;with call bell/phone within reach;with nursing/sitter in room     Time: [id number] PT Time Calculation (min) (  ACUTE ONLY): 23 min  Charges:  $Gait Training: 8-22 mins $Therapeutic Exercise: 8-22 mins           Sallyanne Kuster 08/14/2015, 11:47 AM   Sallyanne Kuster, PTA, CLT Acute Rehab Services [phone] 08/14/2015, 11:47 AM

## 2015-08-14 NOTE — Progress Notes (Signed)
Occupational Therapy Treatment Patient Details Name: Cassandra HissMagdalene Barletta MRN: [id number] DOB: 03/16/1985 Today's Date: 08/14/2015    History of present illness Open reduction and internal fixation of right bicondylar tibial plateau. Right knee arthrotomy with repair of lateral meniscus Anterior compartment fasciotomy, right leg.Arthrotomy, right ankle, with excisionally debridement of skin subcutaneous and fascia as well as portions of torn capsule.Open treatment of right ankle dislocation.Closed treatment of medial malleolus, right ankle.Application of small wound VAC right ankle traumatic wound.   OT comments  Pt. Making gains.  Able to complete bed mobility without assist.  Dons brace appropriately and able to maintain NWB status during transfers.  Reports she will have assist at home.  D/c likely later today.    Follow Up Recommendations  Home health OT    Equipment Recommendations  3 in 1 bedside comode    Recommendations for Other Services      Precautions / Restrictions Precautions Precautions: Fall Precaution Comments: wound vac which she will be going home with Required Braces or Orthoses: Other Brace/Splint Other Brace/Splint: bledsoe brace--unlocked, can have off when not mobilizing Restrictions Weight Bearing Restrictions: Yes RLE Weight Bearing: Non weight bearing       Mobility Bed Mobility Overal bed mobility: Modified Independent Bed Mobility: Supine to Sit     Supine to sit: Modified independent (Device/Increase time)     General bed mobility comments: bed flat  and no rails, able to don brace and guide LLE out of bed without assist and scoot towards eob in preparation for transfer  Transfers Overall transfer level: Needs assistance   Transfers: Sit to/from Stand;Stand Pivot Transfers Sit to Stand: Min guard Stand pivot transfers: Min guard       General transfer comment: assistance for multiple line management only    Balance                                   ADL Overall ADL's : Needs assistance/impaired                         Toilet Transfer: Min IT sales professionalguard;RW;Ambulation Toilet Transfer Details (indicate cue type and reason): simulated with transfer from eob and use of RW to hop towards recliner.  educated on multiple uses of 3n1 as bedside and over the toilet Toileting- ArchitectClothing Manipulation and Hygiene: Min guard;Sit to/from stand Toileting - Clothing Manipulation Details (indicate cue type and reason): simulated during transfer              Vision                     Perception     Praxis      Cognition   Behavior During Therapy: [facility] CenterWFL for tasks assessed/performed Overall Cognitive Status: Within Functional Limits for tasks assessed                       Extremity/Trunk Assessment               Exercises     Shoulder Instructions       General Comments      Pertinent Vitals/ Pain       Pain Assessment: No/denies pain  Home Living  Prior Functioning/Environment              Frequency Min 2X/week     Progress Toward Goals  OT Goals(current goals can now be found in the care plan section)  Progress towards OT goals: Progressing toward goals     Plan Discharge plan remains appropriate    Co-evaluation                 End of Session Equipment Utilized During Treatment: Rolling walker;Other (comment) (bledsoe brace)   Activity Tolerance Patient tolerated treatment well   Patient Left in chair;with call bell/phone within reach   Nurse Communication          Time: 1610-9604 OT Time Calculation (min): 20 min  Charges: OT General Charges $OT Visit: 1 Procedure OT Treatments $Self Care/Home Management : 8-22 mins  Robet Leu, COTA/L 08/14/2015, 9:19 AM

## 2015-08-14 NOTE — Discharge Summary (Signed)
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 nicotine is a vasoconstrictor (essentially, shrinks blood vessels).  Therefore, if vasoconstriction occurs to these microscopic blood vessels they essentially disappear and are unable to deliver necessary nutrients to the healing tissue.  This is one modifiable factor that you can do to dramatically increase your chances of healing your injury.    (This means no smoking, no nicotine gum, patches, etc)  DO NOT USE NONSTEROIDAL ANTI-INFLAMMATORY DRUGS (NSAID'S)  Using products such as Advil (ibuprofen), Aleve (naproxen), Motrin (ibuprofen) for additional pain control during fracture healing can delay and/or prevent the healing response.  If you would like to take over the counter (OTC) medication, Tylenol (acetaminophen) is ok.  However, some narcotic medications that are given for pain control contain acetaminophen as well. Therefore, you should not exceed more than 4000 mg of tylenol in a day if you do not have liver disease.  Also note that there are may OTC medicines, such as cold medicines and allergy medicines that my contain tylenol as well.  If you have any questions about  medications and/or interactions please ask your doctor/PA or your pharmacist.      ICE AND ELEVATE INJURED/OPERATIVE EXTREMITY  Using ice and elevating the injured extremity above your heart can help with swelling and  pain control.  Icing in a pulsatile fashion, such as 20 minutes on and 20 minutes off, can be followed.    Do not place ice directly on skin. Make sure there is a barrier between to skin and the ice pack.    Using frozen items such as frozen peas works well as the conform nicely to the are that needs to be iced.  USE AN ACE WRAP OR TED HOSE FOR SWELLING CONTROL  In addition to icing and elevation, Ace wraps or TED hose are used to help limit and resolve swelling.  It is recommended to use Ace wraps or TED hose until you are informed to stop.    When using Ace Wraps start the wrapping distally (farthest away from the body) and wrap proximally (closer to the body)   Example: If you had surgery on your leg or thing and you do not have a splint on, start the ace wrap at the toes and work your way up to the thigh        If you had surgery on your upper extremity and do not have a splint on, start the ace wrap at your fingers and work your way up to the upper arm  IF YOU ARE IN A SPLINT OR CAST DO NOT [place]   If your splint gets wet for any reason please contact the office immediately. You may shower in your splint or cast as long as you keep it dry.  This can be done by wrapping in a cast cover or garbage back (or similar)  Do Not stick any thing down your splint or cast such as pencils, money, or hangers to try and scratch yourself with.  If you feel itchy take benadryl as prescribed on the bottle for itching  IF YOU ARE IN A CAM BOOT (BLACK BOOT)  You may remove boot periodically. Perform daily dressing changes as noted below.  Wash the liner of the boot regularly and wear a sock when wearing the boot. It is recommended that you sleep in the boot until told  otherwise  CALL THE OFFICE WITH ANY QUESTIONS OR CONCERNS: [phone]     Do not put a pillow under the knee. Place it under the heel.    Complete by:  As directed      Driving restrictions    Complete by:  As directed   No driving until further notice     Increase activity slowly as tolerated    Complete by:  As directed      Non weight bearing    Complete by:  As directed   Laterality:  right  Extremity:  Lower           DISCHARGE MEDICATIONS:     Medication List    TAKE these medications        docusate sodium 100 MG capsule  Commonly known as:  COLACE  Take 1 capsule (100 mg total) by mouth 2 (two) times daily.     enoxaparin 40 MG/0.4ML injection  Commonly known as:  LOVENOX  Inject 0.4 mLs (40 mg total) into the skin daily.     enoxaparin Kit  Commonly known as:  LOVENOX  1 kit by Does not apply route once.     levETIRAcetam 500 MG tablet  Commonly known as:  KEPPRA  Take 500 mg by mouth 2 (two) times daily.     methocarbamol 500 MG tablet  Commonly known as:  ROBAXIN  Take 1-2 tablets (500-1,000 mg total) by mouth 4 (four) times daily.     MICROGESTIN FE 1/20 1-20 MG-MCG tablet  Generic drug:  norethindrone-ethinyl estradiol  Take 1 tablet by mouth daily.     oxyCODONE-acetaminophen 5-325 MG tablet  Commonly known as:  PERCOCET/ROXICET  Take 1-2 tablets by mouth every 6 (six) hours as needed for moderate pain or severe pain.     polyethylene glycol packet  Commonly known as:  MIRALAX / GLYCOLAX  Take 17 g by mouth daily.     traMADol 50 MG tablet  Commonly known as:  ULTRAM  Take 1-2 tablets (50-100 mg total) by mouth every 6 (six) hours as needed for moderate pain or severe pain.        FOLLOW UP VISIT:       Follow-up Information    Follow up with Rozanna Box, MD On 08/17/2015.   Specialty:  Orthopedic Surgery   Why:  For wound re-check   Contact information:   [place] [address] [phone]        Follow up with [facility] [place].   Why:  They will call you within 24-48 hours to arange home health care for registered nurse, physical therapy, and occupational therapy   Contact information:   [address] [place], [place] [address]  [phone]      Follow up with Primary Care Physician. Schedule an appointment as soon as possible for a visit in 1 week.   Why:  Please establish Primary Care Appointment   Contact information:   .Please go to [email or web address] or call the number on the back of your [employer] ID card. .24 hours a day, seven days a week. If you get your insurance through your employer, call [email or web address] or [phone]        DISPOSITION: HOME VS. SNF  CONDITION:  Good   Donia Ast 08/14/2015, 10:13 AM
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
## 2015-08-14 NOTE — Progress Notes (Signed)
Discharge instructions given to both patient and boyfriend. Verbalized understanding.REmainder of dilaudid PCA wasted to sink and witnessed by Black & DeckerMarge Rn

## 2015-08-14 NOTE — Progress Notes (Signed)
Orthopedic Tech Progress Note Patient Details:  Cassandra Price 05/07/1985 [id number]  Patient ID: Cassandra Price, female   DOB: 03/20/1985, 30 y.o.   MRN: [id number] Viewed order from doctor's order list  Cassandra Price, Cassandra Price 08/14/2015, 12:24 PM

## 2015-08-14 NOTE — Progress Notes (Signed)
CM called and spoke with RN, Nicole Cellaorothy, caring for patient who confirmed that patient has Provena Wound vac in place and does not have it removed until she sees the MD. CM [facility] if she does not hear from them Monday. Patient has RW, 3N1, and awaiting crutches to be delivered to the bedside from ortho tech. No further CM needs communicated and [facility] in [place], [place].

## 2015-08-14 NOTE — Progress Notes (Signed)
Patient discharged home.Discharge instructions given and patient verbalized understanding

## 2015-08-14 NOTE — Progress Notes (Signed)
Physical Therapy Treatment Patient Details Name: Cassandra Price MRN: [id number] DOB: Jan 05, 1986 Today's Date: 08/14/2015    History of Present Illness Open reduction and internal fixation of right bicondylar tibial plateau. Right knee arthrotomy with repair of lateral meniscus Anterior compartment fasciotomy, right leg.Arthrotomy, right ankle, with excisionally debridement of skin subcutaneous and fascia as well as portions of torn capsule.Open treatment of right ankle dislocation.Closed treatment of medial malleolus, right ankle.Application of small wound VAC right ankle traumatic wound.    PT Comments    Pt seen a second time due to PTA hearing pt yelling out in room for someone to come in. On arrival to room pt reported RN did not place call bell back in reach and she needed to go to restroom. Assisted pt into bathroom using standard toilet for 1st time vs BSC with no issues. Pt's balance appears to have improved since tested on evaluation. Pt continues to progress toward goals. Call bell placed within reach and pt's boyfriend in room at end of this session.   Follow Up Recommendations  Home health PT;Supervision for mobility/OOB     Equipment Recommendations  Rolling walker with 5" wheels;3in1 (PT)    Precautions / Restrictions Precautions Precautions: Fall Precaution Comments: wound vac which she will be going home with Required Braces or Orthoses: Other Brace/Splint Other Brace/Splint: bledsoe brace--unlocked, can have off when not mobilizing Restrictions Weight Bearing Restrictions: Yes RLE Weight Bearing: Non weight bearing    Mobility  Bed Mobility Overal bed mobility: Modified Independent Bed Mobility: Supine to Sit     Supine to sit: Modified independent (Device/Increase time)     General bed mobility comments: not observed: pt in recliner before and after session  Transfers Overall transfer level: Needs assistance Equipment used: Rolling walker (2  wheeled) Transfers: Sit to/from [employer] Sit to Stand: Supervision Stand pivot transfers: Min guard       General transfer comment: no physical assistance needed, demo'd safe technique. used hand rail and walker to stand from elevated toilet.  Ambulation/Gait Ambulation/Gait assistance: Modified independent (Device/Increase time) Ambulation Distance (Feet): 15 Feet (x2) Assistive device: Crutches Gait Pattern/deviations:  (swing through gait pattern) Gait velocity: decreased Gait velocity interpretation: Below normal speed for age/gender General Gait Details: no issues with use of walker       Balance           Standing balance support: Single extremity supported;No upper extremity supported;During functional activity Standing balance-Leahy Scale: Good Standing balance comment: pt able balance with no UE support to pull down/up pants after toilet use, single to no UE support for washing hands at sink. maintained NWB on right LE through out all of this.                    Cognition Arousal/Alertness: Awake/alert Behavior During Therapy: WFL for tasks assessed/performed Overall Cognitive Status: Within Functional Limits for tasks assessed                      Exercises General Exercises - Lower Extremity Ankle Circles/Pumps: AROM;Strengthening;Right;10 reps;Supine;Limitations Ankle Circles/Pumps Limitations: toe wiggles only  Quad Sets: AROM;Strengthening;Right;10 reps;Supine Heel Slides: AAROM;Strengthening;Right;10 reps;Supine Straight Leg Raises: AROM;Strengthening;Right;10 reps;Supine    General Comments        Pertinent Vitals/Pain Pain Assessment: 0-10 Pain Score: 7  Pain Location: right leg Pain Descriptors / Indicators: Aching;Sore Pain Intervention(s): Limited activity within patient's tolerance;Monitored during session;Premedicated before session;Repositioned     PT Goals (current goals can now  be found in the care  plan section) Acute Rehab PT Goals Patient Stated Goal: get out of the hospital PT Goal Formulation: With patient Time For Goal Achievement: 08/26/15 Potential to Achieve Goals: Good Progress towards PT goals: Progressing toward goals    Frequency  Min 5X/week    PT Plan Current plan remains appropriate    End of Session Equipment Utilized During Treatment: Gait belt;Other (comment) (bledsoe brace) Activity Tolerance: Patient tolerated treatment well Patient left: in chair;with call bell/phone within reach;with family/visitor present     Time: 1140-1157 PT Time Calculation (min) (ACUTE ONLY): 17 min  Charges:  $Gait Training: 8-22 mins $Therapeutic Exercise: 8-22 mins            Sallyanne KusterBury, Steffany Schoenfelder 08/14/2015, 12:02 PM   Sallyanne KusterKathy Keshan Reha, PTA, [facility] [address], [address] [place], [place] [address] [phone] 08/14/2015, 12:05 PM
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
## 2017-11-07 IMAGING — RF DG KNEE 1-2V*L*
1 series · 1 of 1 positions shown · non-contrast
Comparison: None.

CLINICAL DATA: ORIF of right knee.

EXAM:
LEFT KNEE - 1-2 VIEW

[Series 1: run · 1 of 1 slices shown]
[im 1/1]
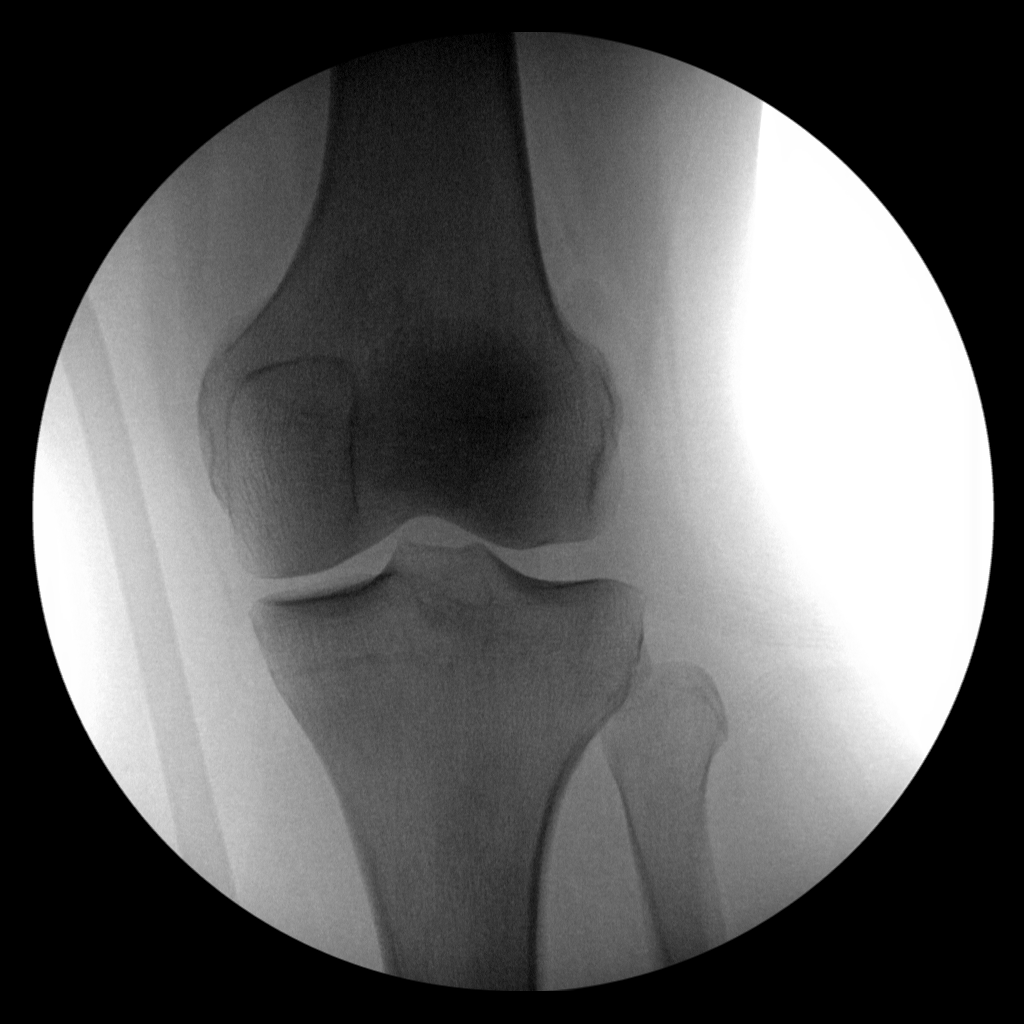

[1 of 1 positions shown; findings below may reference images not displayed]

FINDINGS: Single projection portable radiograph of the left knee is
unremarkable. No evidence of fracture, dislocation, or joint
effusion. No evidence of arthropathy or other focal bone
abnormality. Soft tissues are unremarkable.
IMPRESSION: Negative.

## 2017-11-07 IMAGING — CR DG KNEE 1-2V PORT*R*
2 series · 2 of 2 positions shown · non-contrast
Comparison: None.

CLINICAL DATA: Right tibial plateau surgery

EXAM:
PORTABLE RIGHT KNEE - 1-2 VIEW

[AP]
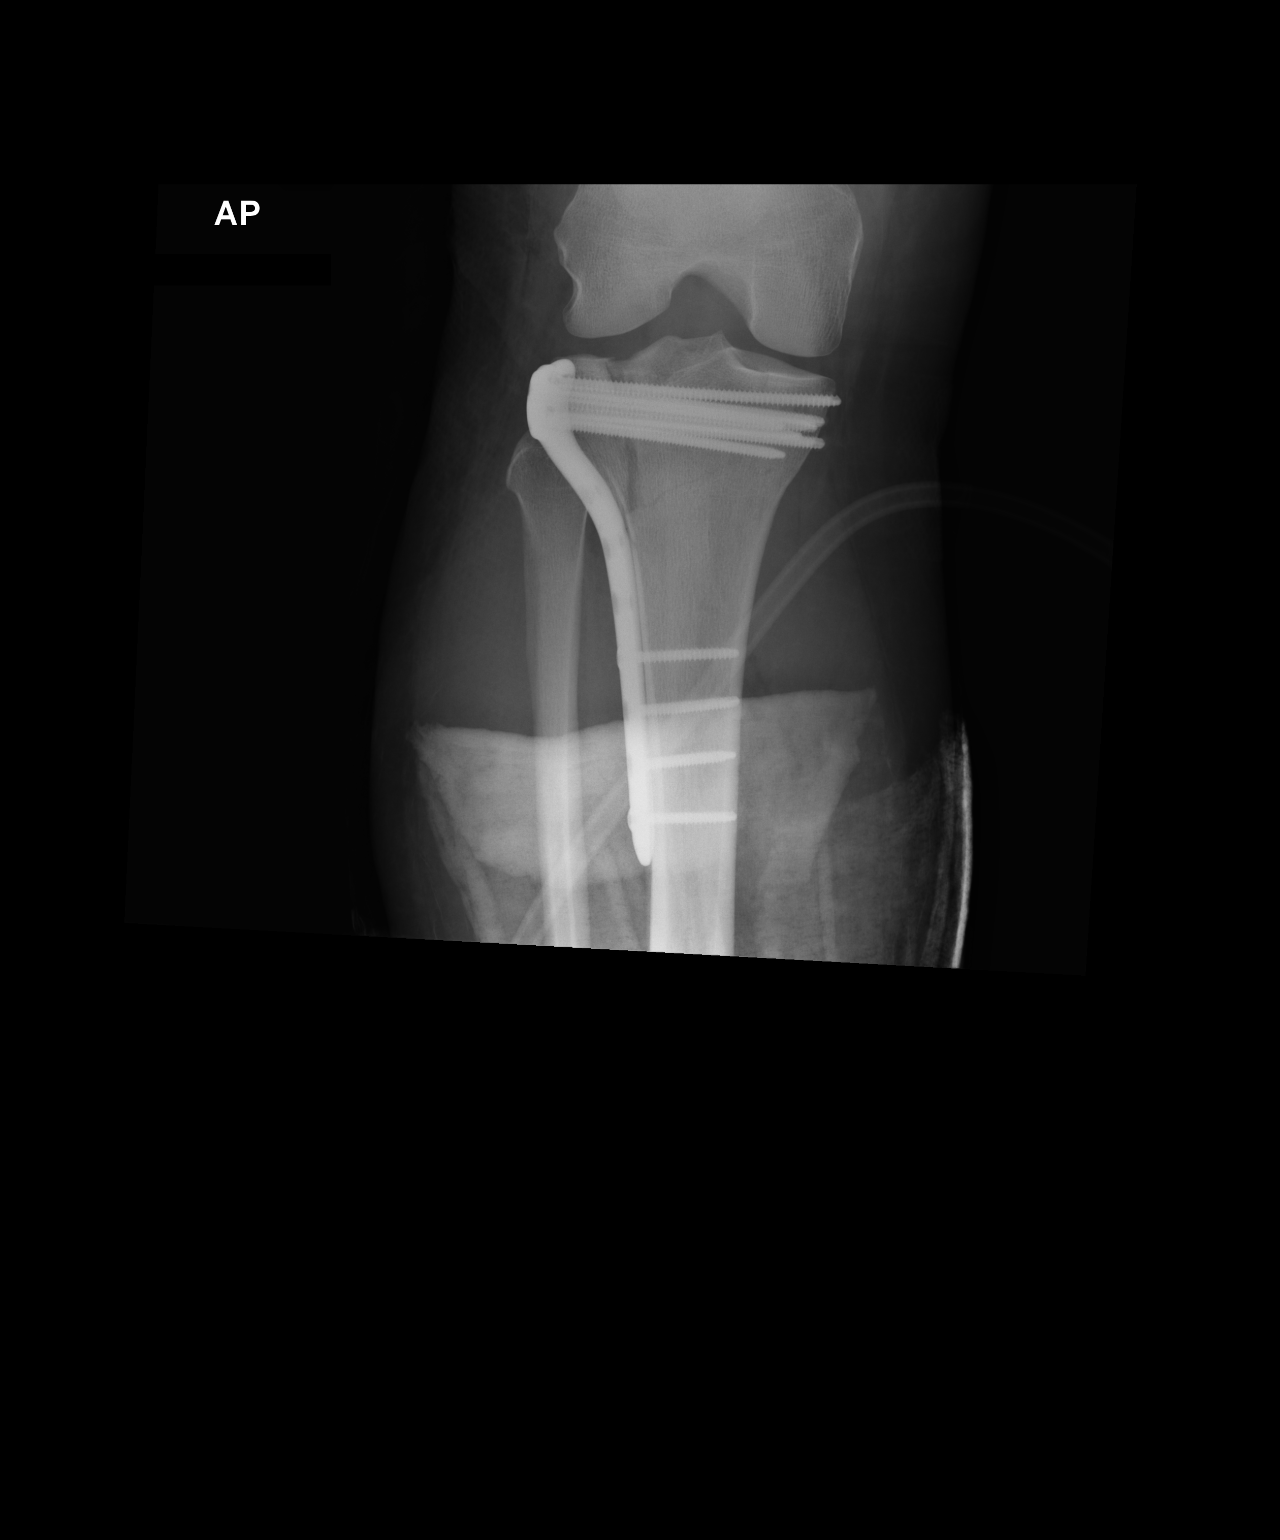

[xtable lateral]
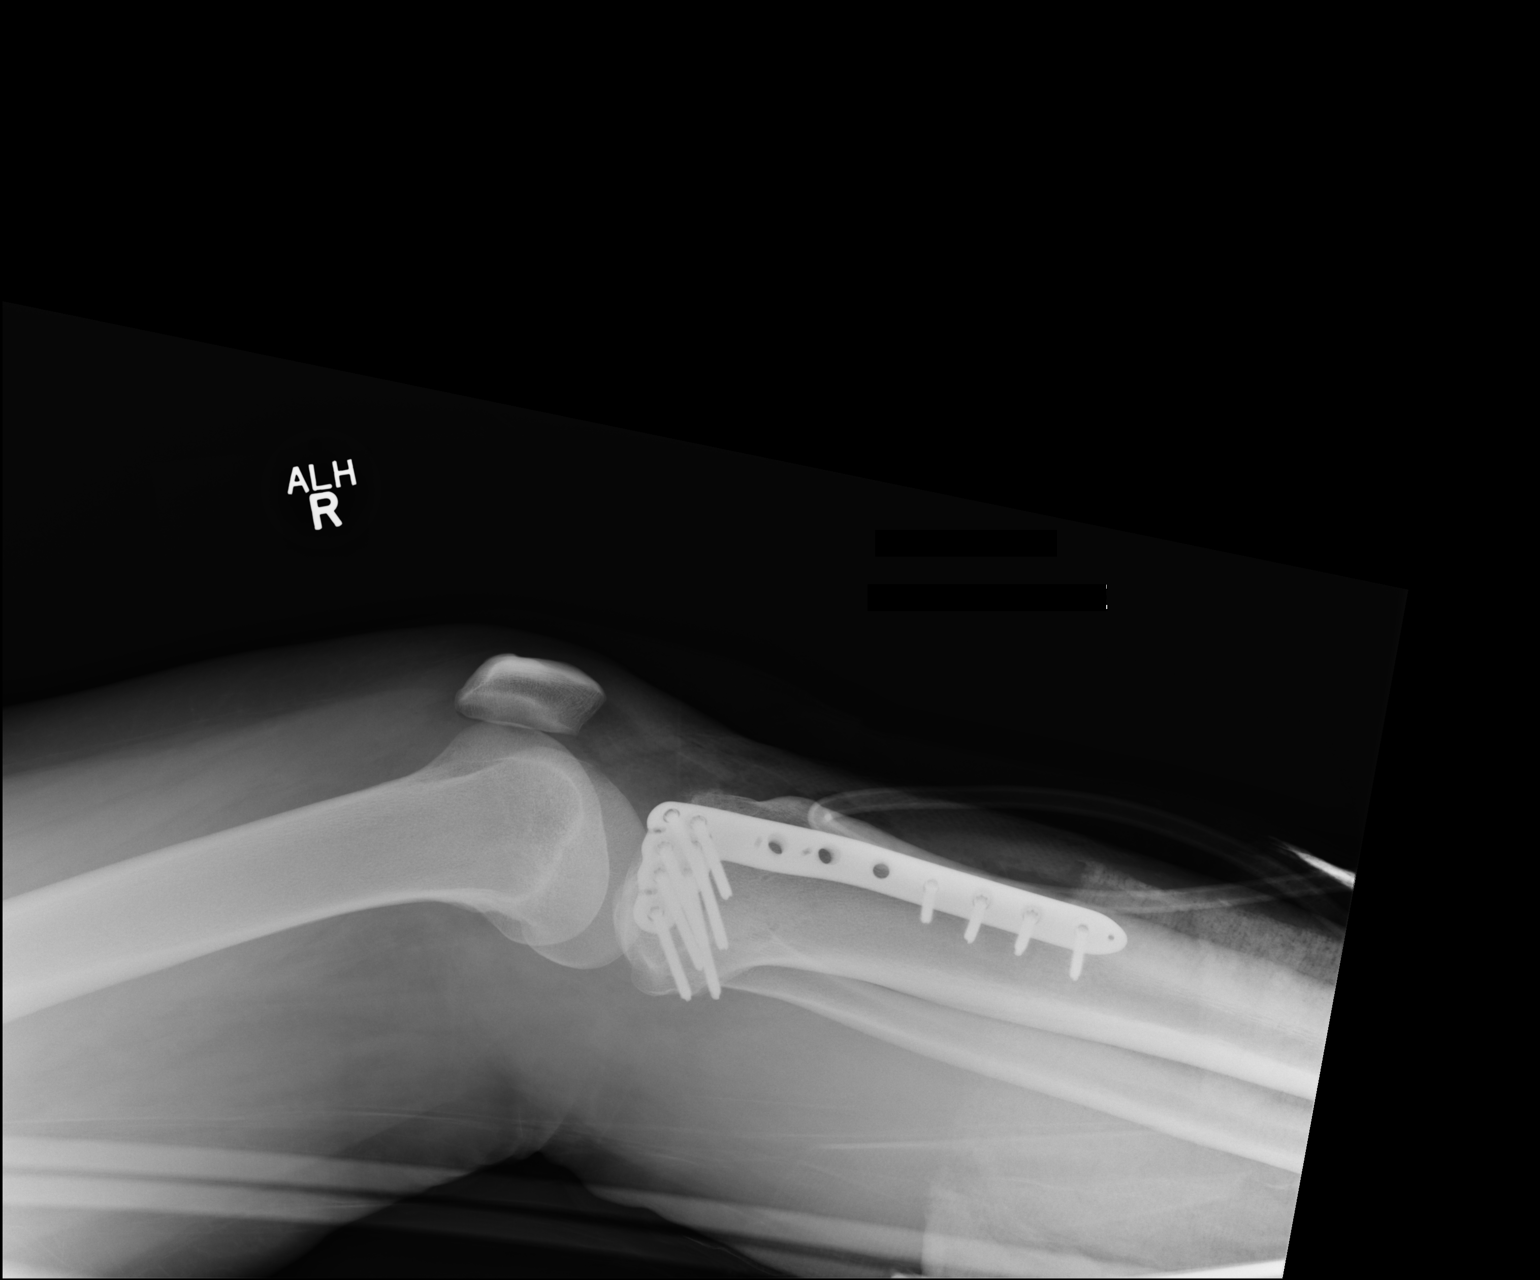

[2 of 2 positions shown; findings below may reference images not displayed]

FINDINGS: The right tibial plateau fracture has been repaired with plate and
screws. Alignment is significantly improved in the interval.
Hardware is in good position.
IMPRESSION: Interval repair of a right tibial plateau fracture.
